# Patient Record
Sex: Female | Born: 1990 | Race: White | Hispanic: No | Marital: Married | Smoking: Former smoker
Health system: Southern US, Community
[De-identification: ages and names within clinical notes are randomized; demographics above are authoritative.]

## PROBLEM LIST (undated history)

## (undated) DIAGNOSIS — S3210XA Unspecified fracture of sacrum, initial encounter for closed fracture: Secondary | ICD-10-CM

## (undated) DIAGNOSIS — F419 Anxiety disorder, unspecified: Secondary | ICD-10-CM

## (undated) DIAGNOSIS — F988 Other specified behavioral and emotional disorders with onset usually occurring in childhood and adolescence: Secondary | ICD-10-CM

## (undated) DIAGNOSIS — S32599A Other specified fracture of unspecified pubis, initial encounter for closed fracture: Secondary | ICD-10-CM

## (undated) DIAGNOSIS — B019 Varicella without complication: Secondary | ICD-10-CM

## (undated) DIAGNOSIS — S060X9A Concussion with loss of consciousness of unspecified duration, initial encounter: Secondary | ICD-10-CM

## (undated) DIAGNOSIS — G43909 Migraine, unspecified, not intractable, without status migrainosus: Secondary | ICD-10-CM

## (undated) DIAGNOSIS — S32519A Fracture of superior rim of unspecified pubis, initial encounter for closed fracture: Secondary | ICD-10-CM

## (undated) DIAGNOSIS — T7840XA Allergy, unspecified, initial encounter: Secondary | ICD-10-CM

## (undated) HISTORY — DX: Migraine, unspecified, not intractable, without status migrainosus: G43.909

## (undated) HISTORY — DX: Other specified fracture of unspecified pubis, initial encounter for closed fracture: S32.599A

## (undated) HISTORY — DX: Varicella without complication: B01.9

## (undated) HISTORY — DX: Concussion with loss of consciousness of unspecified duration, initial encounter: S06.0X9A

## (undated) HISTORY — DX: Allergy, unspecified, initial encounter: T78.40XA

## (undated) HISTORY — DX: Fracture of superior rim of unspecified pubis, initial encounter for closed fracture: S32.519A

## (undated) HISTORY — DX: Unspecified fracture of sacrum, initial encounter for closed fracture: S32.10XA

---

## 2012-06-03 ENCOUNTER — Emergency Department (HOSPITAL_COMMUNITY): Payer: No Typology Code available for payment source

## 2012-06-03 ENCOUNTER — Inpatient Hospital Stay (HOSPITAL_COMMUNITY)
Admission: EM | Admit: 2012-06-03 | Discharge: 2012-06-07 | DRG: 536 | Disposition: A | Discharge status: Rehabilitation Facility | Payer: No Typology Code available for payment source | Attending: General Surgery | Admitting: General Surgery

## 2012-06-03 ENCOUNTER — Observation Stay (HOSPITAL_COMMUNITY): Payer: No Typology Code available for payment source

## 2012-06-03 ENCOUNTER — Encounter (HOSPITAL_COMMUNITY): Payer: Self-pay

## 2012-06-03 DIAGNOSIS — F411 Generalized anxiety disorder: Secondary | ICD-10-CM | POA: Diagnosis present

## 2012-06-03 DIAGNOSIS — S32509A Unspecified fracture of unspecified pubis, initial encounter for closed fracture: Principal | ICD-10-CM | POA: Diagnosis present

## 2012-06-03 DIAGNOSIS — F17200 Nicotine dependence, unspecified, uncomplicated: Secondary | ICD-10-CM | POA: Diagnosis present

## 2012-06-03 DIAGNOSIS — S060X9A Concussion with loss of consciousness of unspecified duration, initial encounter: Secondary | ICD-10-CM | POA: Diagnosis present

## 2012-06-03 DIAGNOSIS — S060XAA Concussion with loss of consciousness status unknown, initial encounter: Secondary | ICD-10-CM | POA: Diagnosis present

## 2012-06-03 DIAGNOSIS — F909 Attention-deficit hyperactivity disorder, unspecified type: Secondary | ICD-10-CM | POA: Diagnosis present

## 2012-06-03 DIAGNOSIS — Z23 Encounter for immunization: Secondary | ICD-10-CM

## 2012-06-03 DIAGNOSIS — S32599A Other specified fracture of unspecified pubis, initial encounter for closed fracture: Secondary | ICD-10-CM | POA: Diagnosis present

## 2012-06-03 DIAGNOSIS — S329XXA Fracture of unspecified parts of lumbosacral spine and pelvis, initial encounter for closed fracture: Secondary | ICD-10-CM

## 2012-06-03 DIAGNOSIS — S060X1A Concussion with loss of consciousness of 30 minutes or less, initial encounter: Secondary | ICD-10-CM | POA: Diagnosis present

## 2012-06-03 DIAGNOSIS — IMO0002 Reserved for concepts with insufficient information to code with codable children: Secondary | ICD-10-CM | POA: Diagnosis present

## 2012-06-03 DIAGNOSIS — T07XXXA Unspecified multiple injuries, initial encounter: Secondary | ICD-10-CM | POA: Diagnosis present

## 2012-06-03 DIAGNOSIS — S32519A Fracture of superior rim of unspecified pubis, initial encounter for closed fracture: Secondary | ICD-10-CM | POA: Diagnosis present

## 2012-06-03 DIAGNOSIS — S3210XA Unspecified fracture of sacrum, initial encounter for closed fracture: Secondary | ICD-10-CM | POA: Diagnosis present

## 2012-06-03 DIAGNOSIS — S322XXA Fracture of coccyx, initial encounter for closed fracture: Secondary | ICD-10-CM | POA: Diagnosis present

## 2012-06-03 HISTORY — DX: Other specified behavioral and emotional disorders with onset usually occurring in childhood and adolescence: F98.8

## 2012-06-03 HISTORY — DX: Anxiety disorder, unspecified: F41.9

## 2012-06-03 LAB — URINALYSIS, ROUTINE W REFLEX MICROSCOPIC
Bilirubin Urine: NEGATIVE
Glucose, UA: NEGATIVE mg/dL
Ketones, ur: NEGATIVE mg/dL
Leukocytes, UA: NEGATIVE
Nitrite: NEGATIVE
Protein, ur: 100 mg/dL — AB
Specific Gravity, Urine: 1.01 (ref 1.005–1.030)
Urobilinogen, UA: 1 mg/dL (ref 0.0–1.0)
pH: 7 (ref 5.0–8.0)

## 2012-06-03 LAB — POCT I-STAT, CHEM 8
BUN: 13 mg/dL (ref 6–23)
Calcium, Ion: 1.11 mmol/L — ABNORMAL LOW (ref 1.12–1.23)
Chloride: 100 mEq/L (ref 96–112)
Creatinine, Ser: 0.8 mg/dL (ref 0.50–1.10)
Glucose, Bld: 138 mg/dL — ABNORMAL HIGH (ref 70–99)
HCT: 42 % (ref 36.0–46.0)
Hemoglobin: 14.3 g/dL (ref 12.0–15.0)
Potassium: 3.5 mEq/L (ref 3.5–5.1)
Sodium: 138 mEq/L (ref 135–145)
TCO2: 24 mmol/L (ref 0–100)

## 2012-06-03 LAB — URINE MICROSCOPIC-ADD ON

## 2012-06-03 LAB — POCT PREGNANCY, URINE: Preg Test, Ur: NEGATIVE

## 2012-06-03 MED ORDER — HYDROMORPHONE HCL PF 1 MG/ML IJ SOLN
2.0000 mg | INTRAMUSCULAR | Status: DC | PRN
  Administered 2012-06-03: 2 mg via INTRAVENOUS

## 2012-06-03 MED ORDER — ENOXAPARIN SODIUM 40 MG/0.4ML ~~LOC~~ SOLN
40.0000 mg | SUBCUTANEOUS | Status: DC
  Administered 2012-06-03 – 2012-06-06 (×4): 40 mg via SUBCUTANEOUS
  Filled 2012-06-03 (×5): qty 0.4

## 2012-06-03 MED ORDER — PANTOPRAZOLE SODIUM 40 MG IV SOLR
40.0000 mg | Freq: Every day | INTRAVENOUS | Status: DC
  Filled 2012-06-03 (×4): qty 40

## 2012-06-03 MED ORDER — HYDROMORPHONE HCL PF 1 MG/ML IJ SOLN
0.5000 mg | INTRAMUSCULAR | Status: DC | PRN
  Administered 2012-06-03 – 2012-06-04 (×5): 1 mg via INTRAVENOUS
  Filled 2012-06-03 (×6): qty 1

## 2012-06-03 MED ORDER — DOCUSATE SODIUM 100 MG PO CAPS
100.0000 mg | ORAL_CAPSULE | Freq: Two times a day (BID) | ORAL | Status: DC
  Administered 2012-06-03 – 2012-06-06 (×6): 100 mg via ORAL
  Filled 2012-06-03 (×10): qty 1

## 2012-06-03 MED ORDER — IOHEXOL 300 MG/ML  SOLN
80.0000 mL | Freq: Once | INTRAMUSCULAR | Status: AC | PRN
  Administered 2012-06-03: 80 mL via INTRAVENOUS

## 2012-06-03 MED ORDER — TETANUS-DIPHTHERIA TOXOIDS TD 5-2 LFU IM INJ: 0.5000 mL | INJECTION | Freq: Once | INTRAMUSCULAR | Status: DC

## 2012-06-03 MED ORDER — KCL IN DEXTROSE-NACL 20-5-0.45 MEQ/L-%-% IV SOLN
INTRAVENOUS | Status: DC
  Administered 2012-06-04 – 2012-06-06 (×2): via INTRAVENOUS
  Filled 2012-06-03 (×7): qty 1000

## 2012-06-03 MED ORDER — ONDANSETRON HCL 4 MG/2ML IJ SOLN
INTRAMUSCULAR | Status: AC
  Administered 2012-06-03: 4 mg via INTRAVENOUS
  Filled 2012-06-03: qty 2

## 2012-06-03 MED ORDER — ONDANSETRON HCL 8 MG PO TABS
4.0000 mg | ORAL_TABLET | Freq: Four times a day (QID) | ORAL | Status: DC | PRN
  Filled 2012-06-03: qty 0.5

## 2012-06-03 MED ORDER — MORPHINE SULFATE 4 MG/ML IJ SOLN
4.0000 mg | Freq: Once | INTRAMUSCULAR | Status: AC
  Administered 2012-06-03: 4 mg via INTRAVENOUS

## 2012-06-03 MED ORDER — SODIUM CHLORIDE 0.9 % IV SOLN
INTRAVENOUS | Status: AC | PRN
  Administered 2012-06-03: 100 mL/h via INTRAVENOUS

## 2012-06-03 MED ORDER — PANTOPRAZOLE SODIUM 40 MG PO TBEC
40.0000 mg | DELAYED_RELEASE_TABLET | Freq: Every day | ORAL | Status: DC
  Administered 2012-06-04 – 2012-06-06 (×3): 40 mg via ORAL
  Filled 2012-06-03 (×3): qty 1

## 2012-06-03 MED ORDER — HYDROMORPHONE HCL PF 2 MG/ML IJ SOLN
INTRAMUSCULAR | Status: AC
  Filled 2012-06-03: qty 1

## 2012-06-03 MED ORDER — TETANUS-DIPHTH-ACELL PERTUSSIS 5-2.5-18.5 LF-MCG/0.5 IM SUSP
INTRAMUSCULAR | Status: AC
  Administered 2012-06-03: 0.5 mL via INTRAMUSCULAR
  Filled 2012-06-03: qty 0.5

## 2012-06-03 MED ORDER — MORPHINE SULFATE 4 MG/ML IJ SOLN
INTRAMUSCULAR | Status: AC
  Filled 2012-06-03: qty 1

## 2012-06-03 MED ORDER — ONDANSETRON HCL 4 MG/2ML IJ SOLN
4.0000 mg | Freq: Four times a day (QID) | INTRAMUSCULAR | Status: DC | PRN
  Administered 2012-06-03 (×2): 4 mg via INTRAVENOUS
  Filled 2012-06-03: qty 2

## 2012-06-03 MED ORDER — MORPHINE SULFATE 4 MG/ML IJ SOLN
4.0000 mg | Freq: Once | INTRAMUSCULAR | Status: AC
  Administered 2012-06-03: 4 mg via INTRAVENOUS
  Filled 2012-06-03: qty 1

## 2012-06-03 MED ORDER — TETANUS-DIPHTH-ACELL PERTUSSIS 5-2.5-18.5 LF-MCG/0.5 IM SUSP
0.5000 mL | Freq: Once | INTRAMUSCULAR | Status: AC
  Administered 2012-06-03: 0.5 mL via INTRAMUSCULAR
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
## 2012-06-03 NOTE — ED Notes (Signed)
Pt given water to sip on when no longer nauseated from medication

## 2012-06-03 NOTE — ED Notes (Signed)
Vital signs stable. 
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
## 2012-06-03 NOTE — ED Notes (Signed)
Chaplain attempting to reach mother for patient to update of status. Unable to reach her at this time.

## 2012-06-03 NOTE — ED Notes (Signed)
Pt desating after Dil[place]. Alert and oriented x 4, pt speaking with family members. Placed on O2 at 2liters/ [place] to maintain sats > 90 %
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
## 2012-06-03 NOTE — ED Notes (Signed)
Pt arrived in CT scan with this RN

## 2012-06-03 NOTE — ED Notes (Signed)
Pt returned from xray and transported upstairs with tech with mother via stretcher.
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
## 2012-06-03 NOTE — ED Notes (Signed)
Inserted a 14 Fr cath, Patient did well

## 2012-06-03 NOTE — ED Notes (Signed)
[place] [employer] at the bedside to talk with patient.
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
## 2012-06-03 NOTE — ED Notes (Signed)
Pt back from CT, mother and sister at the bedside.

## 2012-06-03 NOTE — ED Notes (Signed)
Portable pelvis and chest being completed

## 2012-06-03 NOTE — ED Notes (Signed)
Pt taken back to xray. 

## 2012-06-03 NOTE — ED Notes (Signed)
Dr. Ethelda Chick back at the bedside. Pt not feeling any better after first dose of pain medicine but declining any more at this time.

## 2012-06-04 LAB — CBC
MCH: 32.1 pg (ref 26.0–34.0)
MCV: 92.4 fL (ref 78.0–100.0)
Platelets: 168 10*3/uL (ref 150–400)
RBC: 3.83 MIL/uL — ABNORMAL LOW (ref 3.87–5.11)

## 2012-06-04 MED ORDER — HYDROMORPHONE BOLUS VIA INFUSION: 1.0000 mg | Freq: Once | INTRAVENOUS | Status: DC

## 2012-06-04 MED ORDER — ACETAMINOPHEN 325 MG PO TABS: 650.0000 mg | ORAL_TABLET | Freq: Four times a day (QID) | ORAL | Status: DC | PRN

## 2012-06-04 MED ORDER — HYDROMORPHONE HCL PF 1 MG/ML IJ SOLN
1.0000 mg | Freq: Once | INTRAMUSCULAR | Status: AC
  Administered 2012-06-04: 1 mg via INTRAVENOUS

## 2012-06-04 MED ORDER — OXYCODONE HCL 5 MG PO TABS
10.0000 mg | ORAL_TABLET | ORAL | Status: DC | PRN
  Administered 2012-06-04 – 2012-06-07 (×13): 10 mg via ORAL
  Filled 2012-06-04 (×13): qty 2

## 2012-06-04 MED ORDER — HYDROMORPHONE HCL PF 1 MG/ML IJ SOLN
1.0000 mg | INTRAMUSCULAR | Status: DC | PRN
  Administered 2012-06-04 – 2012-06-06 (×7): 1 mg via INTRAVENOUS
  Filled 2012-06-04 (×2): qty 1
  Filled 2012-06-04: qty 2
  Filled 2012-06-04 (×5): qty 1
  Filled 2012-06-04: qty 2

## 2012-06-04 MED ORDER — HYDROMORPHONE HCL PF 1 MG/ML IJ SOLN: 0.5000 mg | Freq: Once | INTRAMUSCULAR | Status: DC
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
## 2012-06-04 NOTE — Progress Notes (Signed)
Seen together and I spoke to the patient's mother Continue pain control and therapies Patient examined and I agree with the assessment and plan  [name], MD, MPH, FACS Pager: [phone]  06/04/2012 1:07 PM
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
## 2012-06-04 NOTE — Progress Notes (Signed)
Physical Therapy Treatment Patient Details Name: Morgan Braun MRN: [id number] DOB: [birth year] Today's Date: 06/04/2012 Time: 4098-1191 PT Time Calculation (min): 21 min  PT Assessment / Plan / Recommendation Comments on Treatment Session  Pt was pleasent and cooperative but expressed significant pain with all functional mobility.  Pt will benefit from skilled physical therapy to improve activity tolerance and progress WBAT and perform ambulation.    Follow Up Recommendations  Inpatient Rehab    Barriers to Discharge        Equipment Recommendations  Defer to next venue    Recommendations for Other Services Rehab consult  Frequency Min 5X/week   Plan Discharge plan remains appropriate    Precautions / Restrictions Precautions Precautions: Fall Restrictions Weight Bearing Restrictions: Yes RLE Weight Bearing: Weight bearing as tolerated LLE Weight Bearing: Weight bearing as tolerated   Pertinent Vitals/Pain 9/10    Mobility  Bed Mobility Bed Mobility: Sit to Supine;Scooting to HOB Supine to Sit: 1: +2 Total assist;HOB flat Supine to Sit: Patient Percentage: 20% Sitting - Scoot to Edge of Bed: 1: +2 Total assist Sitting - Scoot to Edge of Bed: Patient Percentage: 50% Sit to Supine: 1: +2 Total assist;HOB flat Sit to Supine: Patient Percentage: 40% Scooting to HOB: 1: +2 Total assist Scooting to [facility]: Patient Percentage: 20% Details for Bed Mobility Assistance: Pt req max assist to raise legs and rotate and lower trunk Transfers Transfers: Sit to Stand;Stand to Sit;Stand Pivot Transfers Sit to Stand: 1: +2 Total assist;From bed Sit to Stand: Patient Percentage: 50% Stand to Sit: 1: +2 Total assist;To chair/3-in-1 Stand to Sit: Patient Percentage: 50% Stand Pivot Transfers: 1: +2 Total assist Stand Pivot Transfers: Patient Percentage: 50% Details for Transfer Assistance: +2 (A) to intiate transfer and slowly descend to chair with max cues for technique.  Pt unable  to weight shift to advance LE due to severe pain. Ambulation/Gait Ambulation/Gait Assistance: Not tested (comment)    Exercises     PT Diagnosis: Difficulty walking;Generalized weakness;Acute pain  PT Problem List: Decreased strength;Decreased range of motion;Decreased activity tolerance;Decreased balance;Decreased mobility;Decreased knowledge of use of DME;Pain PT Treatment Interventions: DME instruction;Gait training;Functional mobility training;Therapeutic activities;Therapeutic exercise;Patient/family education   PT Goals Acute Rehab PT Goals PT Goal Formulation: With patient/family Time For Goal Achievement: 06/11/12 Potential to Achieve Goals: Good Pt will go Supine/Side to Sit: with min assist PT Goal: Supine/Side to Sit - Progress: Goal set today Pt will Sit at Edge of Bed: with modified independence;3-5 min PT Goal: Sit at Edge Of Bed - Progress: Progressing toward goal Pt will go Sit to Supine/Side: with min assist PT Goal: Sit to Supine/Side - Progress: Progressing toward goal Pt will go Sit to Stand: with min assist PT Goal: Sit to Stand - Progress: Progressing toward goal Pt will go Stand to Sit: with min assist PT Goal: Stand to Sit - Progress: Goal set today Pt will Transfer Bed to Chair/Chair to Bed: with min assist PT Transfer Goal: Bed to Chair/Chair to Bed - Progress: Goal set today Pt will Stand: with supervision;3 - 5 min PT Goal: Stand - Progress: Goal set today Pt will Ambulate: 16 - 50 feet;with min assist;with rolling walker PT Goal: Ambulate - Progress: Goal set today  Visit Information  Last PT Received On: 06/04/12 Assistance Needed: +1    Subjective Data  Subjective: "I feel very disoriented" Patient Stated Goal: To eventually go back to [place].   Cognition  Overall Cognitive Status: Appears within functional limits for tasks  assessed/performed Arousal/Alertness: Awake/alert Orientation Level: Appears intact for tasks assessed Behavior During  Session: [facility] for tasks performed    Balance  Balance Balance Assessed: Yes Static Sitting Balance Static Sitting - Balance Support: Feet supported Static Sitting - Level of Assistance: 4: Min assist;5: Stand by assistance Static Sitting - Comment/# of Minutes: initially min A required, as pt became acclimated she was able to maintain static balance until fatigue then require min assist again.  Approx 6 mins of static sitting while nsg performed dressing change  End of Session PT - End of Session Equipment Utilized During Treatment: Gait belt Activity Tolerance: Patient limited by pain Patient left: in bed;with call bell/phone within reach;with family/visitor present Nurse Communication: Mobility status   GP     Fabio Asa 06/04/2012, 1:55 PM Charlotte Crumb, PT DPT  [phone]

## 2012-06-04 NOTE — Progress Notes (Signed)
UR completed.   Await PT eval to determine exact DME needs for discharge.

## 2012-06-05 ENCOUNTER — Encounter (INDEPENDENT_AMBULATORY_CARE_PROVIDER_SITE_OTHER): Payer: Self-pay | Admitting: Internal Medicine

## 2012-06-05 DIAGNOSIS — S329XXA Fracture of unspecified parts of lumbosacral spine and pelvis, initial encounter for closed fracture: Secondary | ICD-10-CM

## 2012-06-05 DIAGNOSIS — M7989 Other specified soft tissue disorders: Secondary | ICD-10-CM

## 2012-06-05 DIAGNOSIS — M79609 Pain in unspecified limb: Secondary | ICD-10-CM

## 2012-06-05 DIAGNOSIS — IMO0002 Reserved for concepts with insufficient information to code with codable children: Secondary | ICD-10-CM

## 2012-06-05 MED ORDER — METHOCARBAMOL 500 MG PO TABS
500.0000 mg | ORAL_TABLET | Freq: Four times a day (QID) | ORAL | Status: DC | PRN
  Administered 2012-06-05 – 2012-06-07 (×5): 500 mg via ORAL
  Filled 2012-06-05 (×5): qty 1

## 2012-06-05 MED ORDER — PNEUMOCOCCAL VAC POLYVALENT 25 MCG/0.5ML IJ INJ
0.5000 mL | INJECTION | INTRAMUSCULAR | Status: AC
  Administered 2012-06-06: 0.5 mL via INTRAMUSCULAR
  Filled 2012-06-05: qty 0.5
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
## 2012-06-05 NOTE — Progress Notes (Signed)
Pt getting up to chair with PT. While pt is up all dressings from abrasions changed. Pt has R hand/knuckle dressing that is dry and intact. That dressing not changed. L elbow pt had a mepitel. Mepitel isn't sticking. Abrasion cleaned with normal saline and a nonadherent dressing and gauze applied. No s/sx infection noted. Small amount of serous drainage. R lateral knee has a mepitel that is dry and intact. That dressing wasn't changed. Pt has a L knee gauze dressing that has a moderate amount of serous drainage. Dressing removed. Abrasion cleaned with normal saline and a new gauze dressing applied. No s/sx infection noted. Only a small amount of serous drainage noted from abrasion. Pt has stained dressings of back that were removed and redressed per order. 3 abrasions of upper back and one of low mid back and mid lumbar. All areas free from  s/sx infection. Only small amount of serous drainage noted from abrasions. Xeroform and dry gauze dressings applied and afixed with hypofix tape after cleaning with normal saline. Pt tolerated well.

## 2012-06-05 NOTE — Progress Notes (Signed)
VASCULAR LAB PRELIMINARY  PRELIMINARY  PRELIMINARY  PRELIMINARY  Lower extremity venous duplex completed.    Preliminary report:  No evidence of DVT or superficial thrombus noted in the right lower extremity and left common femoral veins . No evidence of superficial thrombosis or Baker's cyst  Hoke Baer, RVS 06/05/2012, 2:32 PM

## 2012-06-05 NOTE — Progress Notes (Signed)
Physical Therapy Treatment Patient Details Name: Morgan Braun MRN: [id number] DOB: 1991/10/13 Today's Date: 06/05/2012 Time: 1914-7829 PT Time Calculation (min): 47 min  PT Assessment / Plan / Recommendation Comments on Treatment Session  Pt agreeable to PT however continues to be limited due to severe pain with any mobility.  Continue to recommend CIR prior to d/c home to improve overall mobility.     Follow Up Recommendations  Inpatient Rehab    Barriers to Discharge        Equipment Recommendations  Defer to next venue    Recommendations for Other Services    Frequency Min 5X/week   Plan Discharge plan remains appropriate;Frequency remains appropriate    Precautions / Restrictions Precautions Precautions: Fall Restrictions Weight Bearing Restrictions: Yes RLE Weight Bearing: Weight bearing as tolerated LLE Weight Bearing: Weight bearing as tolerated   Pertinent Vitals/Pain 8/10 bilateral pelvis pain; premedicated    Mobility  Bed Mobility Bed Mobility: Sit to Supine;Scooting to HOB Supine to Sit: 1: +2 Total assist;HOB flat Supine to Sit: Patient Percentage: 20% Sitting - Scoot to Edge of Bed: 1: +2 Total assist Sitting - Scoot to Edge of Bed: Patient Percentage: 30% Details for Bed Mobility Assistance: +2 (A) to elevate trunk OOB and elevate LE OOB.  Pt continues to be limited due to severe pain and needs extra time to complete task.   Transfers Transfers: Sit to Stand;Stand to Sit Sit to Stand: 1: +2 Total assist;From bed Sit to Stand: Patient Percentage: 50% Stand to Sit: 1: +2 Total assist;To chair/3-in-1 Stand to Sit: Patient Percentage: 50% Details for Transfer Assistance: +2 (A) to initiate transfer and slowly descend to recliner with max cues for hand placement.   Ambulation/Gait Ambulation/Gait Assistance: 1: +2 Total assist Ambulation/Gait: Patient Percentage: 60% Ambulation Distance (Feet): 4 Feet Assistive device: Rolling walker Ambulation/Gait  Assistance Details: +2 (A) for safety and maintain balance.  Pt tends to keep feet in PF to unweight LE with ambulation.  Pt continues to have difficulty weight shifting bilateral LE due to severe pain. Gait Pattern: Step-to pattern;Decreased stride length;Shuffle;Antalgic;Trunk flexed    Exercises     PT Diagnosis:    PT Problem List:   PT Treatment Interventions:     PT Goals Acute Rehab PT Goals PT Goal Formulation: With patient/family Time For Goal Achievement: 06/11/12 Potential to Achieve Goals: Good Pt will go Supine/Side to Sit: with min assist PT Goal: Supine/Side to Sit - Progress: Progressing toward goal Pt will Sit at Edge of Bed: with modified independence;3-5 min PT Goal: Sit at Edge Of Bed - Progress: Progressing toward goal Pt will go Sit to Supine/Side: with min assist PT Goal: Sit to Supine/Side - Progress: Progressing toward goal Pt will go Sit to Stand: with min assist PT Goal: Sit to Stand - Progress: Progressing toward goal Pt will go Stand to Sit: with min assist PT Goal: Stand to Sit - Progress: Progressing toward goal Pt will Transfer Bed to Chair/Chair to Bed: with min assist PT Transfer Goal: Bed to Chair/Chair to Bed - Progress: Progressing toward goal Pt will Stand: with supervision;3 - 5 min PT Goal: Stand - Progress: Progressing toward goal Pt will Ambulate: 16 - 50 feet;with min assist;with rolling walker PT Goal: Ambulate - Progress: Progressing toward goal  Visit Information  Last PT Received On: 06/05/12 Assistance Needed: +2    Subjective Data  Subjective: "I feel more sore today." Patient Stated Goal: Family agreeable to inpatient rehab   Cognition  Overall Cognitive  Status: Appears within functional limits for tasks assessed/performed Arousal/Alertness: Awake/alert Orientation Level: Appears intact for tasks assessed Behavior During Session: [facility] for tasks performed    Balance  Balance Balance Assessed: Yes Static Sitting  Balance Static Sitting - Balance Support: Feet supported Static Sitting - Level of Assistance: 4: Min assist;5: Stand by assistance Static Sitting - Comment/# of Minutes: Initial min (A) to maintain balance due to posterior lean and able to progress to supervision.  End of Session PT - End of Session Equipment Utilized During Treatment: Gait belt Activity Tolerance: Patient limited by pain Patient left: in chair;with call bell/phone within reach;with nursing in room Nurse Communication: Mobility status   GP     Elvan Ebron 06/05/2012, 3:40 PM Jake Shark, PT DPT [phone]

## 2012-06-05 NOTE — Progress Notes (Signed)
Patient ID: Morgan Braun, female   DOB: 1991/05/12, 21 y.o.   MRN: [id number] Patient is slow with physical therapy no focal neurologic deficits in her lower extremities. Discharge when safe with physical therapy transfers.
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
## 2012-06-05 NOTE — Progress Notes (Signed)
The patient's right LE is more swollen than her left.  Will Get Duplex venous study. This patient has been seen and I agree with the findings and treatment plan.  Marta Lamas. Gae Bon, MD, FACS [phone] (pager) [phone] (direct pager) Trauma Surgeon

## 2012-06-05 NOTE — Progress Notes (Signed)
Physical Therapy Progress Note   06/05/12 1540  PT Visit Information  Last PT Received On 06/05/12  Assistance Needed +2  PT Time Calculation  PT Start Time 1340  PT Stop Time 1407  PT Time Calculation (min) 27 min  Subjective Data  Subjective "I feel more sore today."  Patient Stated Goal Family agreeable to inpatient rehab  Precautions  Precautions Fall  Restrictions  Weight Bearing Restrictions Yes  RLE Weight Bearing WBAT  LLE Weight Bearing WBAT  Cognition  Overall Cognitive Status Appears within functional limits for tasks assessed/performed  Arousal/Alertness Awake/alert  Orientation Level Appears intact for tasks assessed  Behavior During Session [facility] for tasks performed  Bed Mobility  Bed Mobility Supine to Sit  Sit to Supine 1: +2 Total assist;HOB flat  Sit to Supine: Patient Percentage 30%  Scooting to HOB 1: +2 Total assist  Scooting to [facility]: Patient Percentage 20%  Details for Bed Mobility Assistance +2 (A) to slowly descend trunk and elevate LE into bed with max cues for technique.  Pt needed extra time and step by step instructions to complete task.  Transfers  Transfers Sit to Stand;Stand to Sit  Sit to Stand 1: +2 Total assist;From bed  Sit to Stand: Patient Percentage 50%  Stand to Sit 1: +2 Total assist;To chair/3-in-1  Stand to Sit: Patient Percentage 50%  Details for Transfer Assistance +2 (A) to initiate transfer and slowly descend to recliner with max cues for hand placement.    Ambulation/Gait  Ambulation/Gait Assistance 1: +2 Total assist  Ambulation/Gait: Patient Percentage 60%  Ambulation Distance (Feet) 4 Feet  Assistive device Rolling walker  Ambulation/Gait Assistance Details +2 (A) for safety and maintain balance. Pt tends to keep feet in PF to unweight LE with ambulation. Pt continues to have difficulty weight shifting bilateral LE due to severe pain.  Gait Pattern Step-to pattern;Decreased stride length;Shuffle;Antalgic;Trunk flexed  Balance   Balance Assessed Yes  Static Sitting Balance  Static Sitting - Balance Support Feet supported  Static Sitting - Level of Assistance 4: Min assist;5: Stand by assistance  PT - End of Session  Equipment Utilized During Treatment Gait belt  Activity Tolerance Patient limited by pain  Patient left in chair;with call bell/phone within reach;with nursing in room  Nurse Communication Mobility status  PT - Assessment/Plan  Comments on Treatment Session Pt agreeable to PT however continues to be limited due to severe pain with any mobility.  Continue to recommend CIR prior to d/c home to improve overall mobility.   PT Plan Discharge plan remains appropriate;Frequency remains appropriate  PT Frequency Min 5X/week  Follow Up Recommendations Inpatient Rehab  Equipment Recommended Defer to next venue  Acute Rehab PT Goals  PT Goal Formulation With patient/family  Time For Goal Achievement 06/11/12  Potential to Achieve Goals Good  Pt will go Supine/Side to Sit with min assist  PT Goal: Supine/Side to Sit - Progress Progressing toward goal  Pt will Sit at [facility] of Bed with modified independence;3-5 min  PT Goal: Sit at Edge Of Bed - Progress Progressing toward goal  Pt will go Sit to Supine/Side with min assist  PT Goal: Sit to Supine/Side - Progress Progressing toward goal  Pt will go Sit to Stand with min assist  PT Goal: Sit to Stand - Progress Progressing toward goal  Pt will go Stand to Sit with min assist  PT Goal: Stand to Sit - Progress Progressing toward goal  Pt will Transfer Bed to Chair/Chair to Bed  with min assist  PT Transfer Goal: Bed to Chair/Chair to Bed - Progress Progressing toward goal  Pt will Stand with supervision;3 - 5 min  PT Goal: Stand - Progress Progressing toward goal  Pt will Ambulate 16 - 50 feet;with min assist;with rolling walker  PT Goal: Ambulate - Progress Progressing toward goal     [facility], PT DPT [phone]
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
## 2012-06-06 DIAGNOSIS — S060X9A Concussion with loss of consciousness of unspecified duration, initial encounter: Secondary | ICD-10-CM

## 2012-06-06 MED ORDER — POLYETHYLENE GLYCOL 3350 17 G PO PACK
17.0000 g | PACK | Freq: Every day | ORAL | Status: DC
  Administered 2012-06-06 – 2012-06-07 (×2): 17 g via ORAL
  Filled 2012-06-06 (×2): qty 1

## 2012-06-06 NOTE — Progress Notes (Signed)
06/06/12 Pt refusing hygiene regimen. Pt has been asked numerous times throughout the day whether she would like a bath. Pt states she would, but not until a certain time in the future. Nursing staff, Tech and OT have all offered to help her, but she will put it off each time. Will continue to encourage pt to comply with hygiene regimen and will pass along to next shift to do so as well.

## 2012-06-06 NOTE — Progress Notes (Signed)
Physical Therapy Progress Note   06/06/12 1400  PT Visit Information  Last PT Received On 06/06/12  Assistance Needed +2  PT Time Calculation  PT Start Time 1152  PT Stop Time 1209  PT Time Calculation (min) 17 min  Subjective Data  Subjective "Pain much better today."  Precautions  Precautions Fall  Restrictions  Weight Bearing Restrictions Yes  RLE Weight Bearing WBAT  LLE Weight Bearing WBAT  Cognition  Overall Cognitive Status Appears within functional limits for tasks assessed/performed  Arousal/Alertness Awake/alert  Orientation Level Appears intact for tasks assessed  Behavior During Session [facility] for tasks performed  Bed Mobility  Bed Mobility Sit to Supine  Sit to Supine 1: +2 Total assist;HOB flat  Sit to Supine: Patient Percentage 30%  Scooting to HOB 1: +2 Total assist  Scooting to [facility]: Patient Percentage 20%  Details for Bed Mobility Assistance +2 (A) to slowly descend trunk into bed and elevate LE into bed with max cues for technique.  Transfers  Transfers Sit to Stand;Stand to Sit  Sit to Stand 1: +2 Total assist;From bed  Sit to Stand: Patient Percentage 60%  Stand to Sit 1: +2 Total assist;To chair/3-in-1  Stand to Sit: Patient Percentage 60%  Details for Transfer Assistance Pt. requires increased time to move sit to stand, and step by step cues to push through arms and to pivot feet/take step.  Assist required to move walker  Ambulation/Gait  Ambulation/Gait Assistance 1: +2 Total assist  Ambulation/Gait: Patient Percentage 60%  Ambulation Distance (Feet) 6 Feet  Assistive device Rolling walker  Ambulation/Gait Assistance Details +2 (A) for safety and maintain balance. Pt tends to keep feet in PF to unweight LE with ambulation. Pt continues to have difficulty weight shifting bilateral LE due to severe pain  Gait Pattern Step-to pattern;Decreased stride length;Shuffle;Antalgic;Trunk flexed  PT - End of Session  Equipment Utilized During Treatment Gait belt    Activity Tolerance Patient limited by pain  Patient left in bed;with call bell/phone within reach;with family/visitor present  Nurse Communication Mobility status  PT - Assessment/Plan  Comments on Treatment Session Pt continues to be limited due to pain but very willing to try and attempt PT.  Pt able to increase ambulation distance but having difficulty with weight shifts due to pain.  PT Plan Discharge plan remains appropriate;Frequency remains appropriate  PT Frequency Min 5X/week  Follow Up Recommendations Inpatient Rehab  Equipment Recommended Defer to next venue  Acute Rehab PT Goals  PT Goal Formulation With patient/family  Time For Goal Achievement 06/11/12  Potential to Achieve Goals Good  Pt will go Supine/Side to Sit with min assist  PT Goal: Supine/Side to Sit - Progress Progressing toward goal  Pt will Sit at [facility] of Bed with modified independence;3-5 min  PT Goal: Sit at Edge Of Bed - Progress Progressing toward goal  Pt will go Sit to Supine/Side with min assist  PT Goal: Sit to Supine/Side - Progress Progressing toward goal  Pt will go Sit to Stand with min assist  PT Goal: Sit to Stand - Progress Progressing toward goal  Pt will go Stand to Sit with min assist  PT Goal: Stand to Sit - Progress Progressing toward goal  Pt will Transfer Bed to Chair/Chair to Bed with min assist  PT Transfer Goal: Bed to Chair/Chair to Bed - Progress Progressing toward goal  Pt will Stand with supervision;3 - 5 min  PT Goal: Stand - Progress Progressing toward goal  Pt will Ambulate  16 - 50 feet;with min assist;with rolling walker  PT Goal: Ambulate - Progress Progressing toward goal  PT General Charges  $$ ACUTE PT VISIT 1 Procedure  PT Treatments  $Therapeutic Activity 8-22 mins    [place], [place] DPT [phone]

## 2012-06-06 NOTE — Progress Notes (Signed)
Physical Therapy Treatment Patient Details Name: Morgan Braun MRN: [id number] DOB: [birth year] Today's Date: 06/06/2012 Time: 7829-5621 PT Time Calculation (min): 39 min  PT Assessment / Plan / Recommendation Comments on Treatment Session  Pt continues to show slow progression with slow improvement with overall pain.    Follow Up Recommendations  Inpatient Rehab    Barriers to Discharge        Equipment Recommendations  Defer to next venue    Recommendations for Other Services    Frequency Min 5X/week   Plan Discharge plan remains appropriate;Frequency remains appropriate    Precautions / Restrictions Precautions Precautions: Fall Restrictions Weight Bearing Restrictions: Yes RLE Weight Bearing: Weight bearing as tolerated LLE Weight Bearing: Weight bearing as tolerated   Pertinent Vitals/Pain 6-7/10 bilateral hip pain    Mobility  Bed Mobility Bed Mobility: Supine to Sit;Sitting - Scoot to Edge of Bed Supine to Sit: 1: +2 Total assist;HOB flat Supine to Sit: Patient Percentage: 40% Sitting - Scoot to Edge of Bed: 1: +2 Total assist Sitting - Scoot to Edge of Bed: Patient Percentage: 40% Details for Bed Mobility Assistance: Pt. requires assist with bil. UEs and step by step cues for hand placement to lift UB.   Transfers Sit to Stand: 1: +2 Total assist;From bed Sit to Stand: Patient Percentage: 60% Stand to Sit: 1: +2 Total assist;To chair/3-in-1 Stand to Sit: Patient Percentage: 60% Details for Transfer Assistance: Pt. requires increased time to move sit to stand, and step by step cues to push through arms and to pivot feet/take step.  Assist required to move walker Ambulation/Gait Ambulation/Gait Assistance: 1: +2 Total assist Ambulation/Gait: Patient Percentage: 60% Ambulation Distance (Feet): 5 Feet Assistive device: Rolling walker Ambulation/Gait Assistance Details: +2 (A) for safety and maintain balance. Pt tends to keep feet in PF to unweight LE with  ambulation. Pt continues to have difficulty weight shifting bilateral LE due to severe pain. Gait Pattern: Step-to pattern;Decreased stride length;Shuffle;Antalgic;Trunk flexed    Exercises     PT Diagnosis:    PT Problem List:   PT Treatment Interventions:     PT Goals Acute Rehab PT Goals PT Goal Formulation: With patient/family Time For Goal Achievement: 06/11/12 Potential to Achieve Goals: Good Pt will go Supine/Side to Sit: with min assist PT Goal: Supine/Side to Sit - Progress: Progressing toward goal Pt will Sit at Edge of Bed: with modified independence;3-5 min PT Goal: Sit at Edge Of Bed - Progress: Progressing toward goal Pt will go Sit to Supine/Side: with min assist PT Goal: Sit to Supine/Side - Progress: Progressing toward goal Pt will go Sit to Stand: with min assist PT Goal: Sit to Stand - Progress: Progressing toward goal Pt will go Stand to Sit: with min assist PT Goal: Stand to Sit - Progress: Progressing toward goal Pt will Transfer Bed to Chair/Chair to Bed: with min assist PT Transfer Goal: Bed to Chair/Chair to Bed - Progress: Progressing toward goal Pt will Stand: with supervision;3 - 5 min PT Goal: Stand - Progress: Progressing toward goal Pt will Ambulate: 16 - 50 feet;with min assist;with rolling walker PT Goal: Ambulate - Progress: Progressing toward goal  Visit Information  Last PT Received On: 06/06/12 Assistance Needed: +2    Subjective Data  Subjective: "I'm feeling a little better today."   Cognition  Overall Cognitive Status: Appears within functional limits for tasks assessed/performed Arousal/Alertness: Awake/alert Orientation Level: Appears intact for tasks assessed Behavior During Session: [facility] for tasks performed Cognition - Other Comments: No  obvious cognitive deficits noted during Eval.  Pt. answering questions appropriately, alternating attention, and recalling events of yesterday.  Will continue to asses cognition through function     Balance  Balance Balance Assessed: Yes Static Sitting Balance Static Sitting - Balance Support: Feet supported Static Sitting - Level of Assistance: 4: Min assist;5: Stand by assistance Static Sitting - Comment/# of Minutes: Initial min (A) to maintain balance due to posterior lean  End of Session PT - End of Session Equipment Utilized During Treatment: Gait belt Activity Tolerance: Patient limited by pain Patient left: in chair;with call bell/phone within reach;with nursing in room Nurse Communication: Mobility status   GP     Morgan Braun 06/06/2012, 1:36 PM
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
## 2012-06-06 NOTE — Progress Notes (Signed)
06/06/12 1800  OT Visit Information  Last OT Received On 06/06/12  Assistance Needed +2  OT Time Calculation  OT Start Time 1152  OT Stop Time 1209  OT Time Calculation (min) 17 min  Precautions  Precautions Fall  Restrictions  Weight Bearing Restrictions Yes  RLE Weight Bearing WBAT  LLE Weight Bearing WBAT  ADL  Toilet Transfer Simulated;+2 Total assistance  Toilet Transfer: Patient Percentage 60%  Toilet Transfer Method Stand pivot  Transfers/Ambulation Related to ADLs Pt transferred to chair with total A+2 (Pt. ~60%).    ADL Comments Assisted pt. back to bed.  Requiers verbal cues for walker sequence and placement.  Pain continues to be limiting factor for ADLs   Cognition  Overall Cognitive Status Appears within functional limits for tasks assessed/performed  Arousal/Alertness Awake/alert  Orientation Level Appears intact for tasks assessed  Behavior During Session [facility] for tasks performed  Cognition - Other Comments No obvious cognitive deficits;however, spoke with CNA, who reports pt is refusing bath and has done so for three days.  Bed Mobility  Bed Mobility Sit to Supine  Sit to Supine 1: +2 Total assist;HOB flat  Sit to Supine: Patient Percentage 30%  Scooting to HOB 1: +2 Total assist  Scooting to [facility]: Patient Percentage 20%  Details for Bed Mobility Assistance +2 (A) to slowly descend trunk into bed and elevate LE into bed with max cues for technique.  Transfers  Transfers Sit to Stand;Stand to Sit  Sit to Stand 1: +2 Total assist;From bed  Sit to Stand: Patient Percentage 60%  Stand to Sit 1: +2 Total assist;To chair/3-in-1  Stand to Sit: Patient Percentage 60%  Details for Transfer Assistance Pt. requires increased time to move sit to stand, and step by step cues to push through arms and to pivot feet/take step.  Assist required to move walker  Balance  Balance Assessed Yes  Static Sitting Balance  Static Sitting - Balance Support Feet supported  Static  Sitting - Level of Assistance 4: Min assist;5: Stand by assistance  Static Sitting - Comment/# of Minutes Initial min (A) to maintain balance due to posterior lean  OT - End of Session  Equipment Utilized During Treatment Gait belt  Activity Tolerance Patient limited by pain  Patient left in chair;with call bell/phone within reach;with family/visitor present  Nurse Communication Mobility status  OT Assessment/Plan  Comments on Treatment Session Pain is limiting factor for ADLs at this time.  Pt. is motivated.  Recommend CIR  OT Plan Discharge plan remains appropriate  OT Frequency Min 2X/week  Follow Up Recommendations Inpatient Rehab;Supervision/Assistance - 24 hour  Equipment Recommended Defer to next venue  ADL Goals  ADL Goal: Toilet Transfer - Progress Progressing toward goals    Jeani Hawking, OTR/L [phone]

## 2012-06-06 NOTE — Progress Notes (Signed)
Received call from Moyock from [place].  She was requesting rehab notes on this patient in order to approve inpatient rehab transfer.  I printed and faxed the PT/OT notes to her as requested and gave her the Rehab Admission coordinator, B. Boyette's, name and phone number to call the determination.  Left voicemail for BBoyette to advise her of this.   Trula Ore at [place] Phone: [phone] Fax: [phone]

## 2012-06-06 NOTE — Progress Notes (Signed)
We plan admit to [facility] tomorrow with insurance approval. [phone]

## 2012-06-06 NOTE — Progress Notes (Signed)
I met with patient and Mom at bedside to discuss inpt rehab venue. I have begun authorization with insurance for admit to CIR tomorrow. I also contacted admitting to notify them of third insurance policy. Please call me at [phone] with questions.
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
## 2012-06-06 NOTE — Progress Notes (Signed)
Complained of numbness and tingling in right foot, cms (+) wiggles toes, denies any pain, Dr. Magnus Ivan notified

## 2012-06-07 ENCOUNTER — Inpatient Hospital Stay (HOSPITAL_COMMUNITY)
Admission: RE | Admit: 2012-06-07 | Discharge: 2012-06-15 | DRG: 945 | Disposition: A | Discharge status: Home / Self Care | Payer: No Typology Code available for payment source | Source: Ambulatory Visit | Attending: Physical Medicine & Rehabilitation | Admitting: Physical Medicine & Rehabilitation

## 2012-06-07 DIAGNOSIS — S3210XA Unspecified fracture of sacrum, initial encounter for closed fracture: Secondary | ICD-10-CM | POA: Diagnosis present

## 2012-06-07 DIAGNOSIS — S32519A Fracture of superior rim of unspecified pubis, initial encounter for closed fracture: Secondary | ICD-10-CM | POA: Diagnosis present

## 2012-06-07 DIAGNOSIS — S060X9A Concussion with loss of consciousness of unspecified duration, initial encounter: Secondary | ICD-10-CM | POA: Diagnosis present

## 2012-06-07 DIAGNOSIS — S069X0A Unspecified intracranial injury without loss of consciousness, initial encounter: Secondary | ICD-10-CM | POA: Diagnosis present

## 2012-06-07 DIAGNOSIS — Z5189 Encounter for other specified aftercare: Secondary | ICD-10-CM

## 2012-06-07 DIAGNOSIS — S32409A Unspecified fracture of unspecified acetabulum, initial encounter for closed fracture: Secondary | ICD-10-CM | POA: Diagnosis present

## 2012-06-07 DIAGNOSIS — S322XXA Fracture of coccyx, initial encounter for closed fracture: Secondary | ICD-10-CM | POA: Diagnosis present

## 2012-06-07 DIAGNOSIS — T1490XA Injury, unspecified, initial encounter: Secondary | ICD-10-CM

## 2012-06-07 DIAGNOSIS — IMO0002 Reserved for concepts with insufficient information to code with codable children: Secondary | ICD-10-CM

## 2012-06-07 DIAGNOSIS — F988 Other specified behavioral and emotional disorders with onset usually occurring in childhood and adolescence: Secondary | ICD-10-CM | POA: Insufficient documentation

## 2012-06-07 DIAGNOSIS — S32599A Other specified fracture of unspecified pubis, initial encounter for closed fracture: Secondary | ICD-10-CM

## 2012-06-07 DIAGNOSIS — F909 Attention-deficit hyperactivity disorder, unspecified type: Secondary | ICD-10-CM | POA: Diagnosis present

## 2012-06-07 DIAGNOSIS — F419 Anxiety disorder, unspecified: Secondary | ICD-10-CM | POA: Insufficient documentation

## 2012-06-07 DIAGNOSIS — T07XXXA Unspecified multiple injuries, initial encounter: Secondary | ICD-10-CM | POA: Diagnosis present

## 2012-06-07 DIAGNOSIS — S329XXA Fracture of unspecified parts of lumbosacral spine and pelvis, initial encounter for closed fracture: Secondary | ICD-10-CM

## 2012-06-07 DIAGNOSIS — S060XAA Concussion with loss of consciousness status unknown, initial encounter: Secondary | ICD-10-CM

## 2012-06-07 DIAGNOSIS — F3289 Other specified depressive episodes: Secondary | ICD-10-CM | POA: Diagnosis present

## 2012-06-07 DIAGNOSIS — S32509A Unspecified fracture of unspecified pubis, initial encounter for closed fracture: Secondary | ICD-10-CM | POA: Diagnosis present

## 2012-06-07 DIAGNOSIS — K5900 Constipation, unspecified: Secondary | ICD-10-CM | POA: Diagnosis present

## 2012-06-07 DIAGNOSIS — F329 Major depressive disorder, single episode, unspecified: Secondary | ICD-10-CM | POA: Diagnosis present

## 2012-06-07 DIAGNOSIS — F411 Generalized anxiety disorder: Secondary | ICD-10-CM | POA: Diagnosis present

## 2012-06-07 HISTORY — DX: Unspecified fracture of sacrum, initial encounter for closed fracture: S32.10XA

## 2012-06-07 HISTORY — DX: Fracture of superior rim of unspecified pubis, initial encounter for closed fracture: S32.519A

## 2012-06-07 HISTORY — DX: Concussion with loss of consciousness status unknown, initial encounter: S06.0XAA

## 2012-06-07 HISTORY — DX: Concussion with loss of consciousness of unspecified duration, initial encounter: S06.0X9A

## 2012-06-07 HISTORY — DX: Other specified fracture of unspecified pubis, initial encounter for closed fracture: S32.599A

## 2012-06-07 MED ORDER — POLYETHYLENE GLYCOL 3350 17 G PO PACK: 17.0000 g | PACK | Freq: Every day | ORAL | Status: DC

## 2012-06-07 MED ORDER — ENOXAPARIN SODIUM 40 MG/0.4ML ~~LOC~~ SOLN: 40.0000 mg | SUBCUTANEOUS | Status: DC

## 2012-06-07 MED ORDER — LISDEXAMFETAMINE DIMESYLATE 50 MG PO CAPS: 50.0000 mg | ORAL_CAPSULE | Freq: Every day | ORAL | Status: DC

## 2012-06-07 MED ORDER — POLYETHYLENE GLYCOL 3350 17 G PO PACK
17.0000 g | PACK | Freq: Every day | ORAL | Status: DC
  Administered 2012-06-08 – 2012-06-15 (×8): 17 g via ORAL
  Filled 2012-06-07 (×10): qty 1

## 2012-06-07 MED ORDER — ONDANSETRON HCL 4 MG/2ML IJ SOLN: 4.0000 mg | Freq: Four times a day (QID) | INTRAMUSCULAR | Status: DC | PRN

## 2012-06-07 MED ORDER — OXYCODONE HCL 5 MG PO TABS: 10.0000 mg | ORAL_TABLET | ORAL | Status: DC | PRN

## 2012-06-07 MED ORDER — METHOCARBAMOL 500 MG PO TABS: 500.0000 mg | ORAL_TABLET | Freq: Four times a day (QID) | ORAL | Status: DC | PRN

## 2012-06-07 MED ORDER — OXYCODONE HCL 5 MG PO TABS
5.0000 mg | ORAL_TABLET | ORAL | Status: DC | PRN
  Administered 2012-06-07 (×2): 15 mg via ORAL
  Filled 2012-06-07 (×2): qty 3

## 2012-06-07 MED ORDER — ACETAMINOPHEN 325 MG PO TABS
325.0000 mg | ORAL_TABLET | ORAL | Status: DC | PRN
  Administered 2012-06-07 – 2012-06-08 (×2): 650 mg via ORAL
  Filled 2012-06-07 (×3): qty 2

## 2012-06-07 MED ORDER — FLUOXETINE HCL 20 MG PO CAPS: 40.0000 mg | ORAL_CAPSULE | Freq: Every day | ORAL | Status: DC

## 2012-06-07 MED ORDER — SORBITOL 70 % SOLN: 30.0000 mL | Freq: Every day | Status: DC | PRN

## 2012-06-07 MED ORDER — METHOCARBAMOL 500 MG PO TABS
500.0000 mg | ORAL_TABLET | Freq: Four times a day (QID) | ORAL | Status: DC | PRN
  Administered 2012-06-07 – 2012-06-10 (×7): 500 mg via ORAL
  Filled 2012-06-07 (×8): qty 1

## 2012-06-07 MED ORDER — ACETAMINOPHEN 325 MG PO TABS: 325.0000 mg | ORAL_TABLET | ORAL | Status: DC | PRN

## 2012-06-07 MED ORDER — ONDANSETRON HCL 4 MG PO TABS: 4.0000 mg | ORAL_TABLET | Freq: Four times a day (QID) | ORAL | Status: DC | PRN

## 2012-06-07 MED ORDER — BISACODYL 5 MG PO TBEC
5.0000 mg | DELAYED_RELEASE_TABLET | Freq: Every day | ORAL | Status: DC
  Administered 2012-06-07: 5 mg via ORAL
  Filled 2012-06-07: qty 1

## 2012-06-07 MED ORDER — FLUOXETINE HCL 40 MG PO CAPS
40.0000 mg | ORAL_CAPSULE | Freq: Every day | ORAL | Status: DC
  Administered 2012-06-07: 40 mg via ORAL
  Filled 2012-06-07 (×2): qty 1

## 2012-06-07 MED ORDER — SENNOSIDES-DOCUSATE SODIUM 8.6-50 MG PO TABS
1.0000 | ORAL_TABLET | Freq: Every evening | ORAL | Status: DC | PRN
  Administered 2012-06-08 – 2012-06-09 (×2): 1 via ORAL
  Filled 2012-06-07 (×2): qty 1

## 2012-06-07 MED ORDER — ENOXAPARIN SODIUM 40 MG/0.4ML ~~LOC~~ SOLN
40.0000 mg | SUBCUTANEOUS | Status: DC
  Administered 2012-06-07 – 2012-06-13 (×7): 40 mg via SUBCUTANEOUS
  Filled 2012-06-07 (×9): qty 0.4

## 2012-06-07 MED ORDER — FLUOXETINE HCL 20 MG PO CAPS
40.0000 mg | ORAL_CAPSULE | Freq: Every day | ORAL | Status: DC
  Administered 2012-06-08 – 2012-06-15 (×8): 40 mg via ORAL
  Filled 2012-06-07 (×10): qty 2

## 2012-06-07 MED ORDER — DOCUSATE SODIUM 100 MG PO CAPS
200.0000 mg | ORAL_CAPSULE | Freq: Two times a day (BID) | ORAL | Status: DC
  Administered 2012-06-07: 200 mg via ORAL

## 2012-06-07 MED ORDER — SORBITOL 70 % SOLN
30.0000 mL | Freq: Every day | Status: DC | PRN
  Administered 2012-06-08 – 2012-06-09 (×2): 30 mL via ORAL
  Filled 2012-06-07 (×2): qty 30

## 2012-06-07 MED ORDER — OXYCODONE HCL 5 MG PO TABS
15.0000 mg | ORAL_TABLET | ORAL | Status: DC | PRN
  Administered 2012-06-07 – 2012-06-14 (×33): 15 mg via ORAL
  Filled 2012-06-07 (×5): qty 3
  Filled 2012-06-07: qty 1
  Filled 2012-06-07 (×5): qty 3
  Filled 2012-06-07: qty 1
  Filled 2012-06-07 (×22): qty 3
  Filled 2012-06-07: qty 2
  Filled 2012-06-07: qty 3

## 2012-06-07 MED ORDER — HYDROMORPHONE HCL PF 1 MG/ML IJ SOLN: 0.5000 mg | INTRAMUSCULAR | Status: DC | PRN
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
## 2012-06-07 NOTE — Progress Notes (Signed)
Patient ID: Morgan Braun, female   DOB: 1991/08/19, 21 y.o.   MRN: [id number]   LOS: 4 days   Subjective: No c/o.  Objective: Vital signs in last 24 hours: Temp:  [98.3 F (36.8 C)-98.7 F (37.1 C)] 98.5 F (36.9 C) (08/16 0500) Pulse Rate:  [79-90] 86  (08/16 0500) Resp:  [16-18] 18  (08/16 0500) BP: (112-115)/(65-86) 115/86 mmHg (08/16 0500) SpO2:  [99 %-100 %] 99 % (08/16 0500) Last BM Date: 06/03/12   General appearance: alert and no distress Resp: clear to auscultation bilaterally Cardio: regular rate and rhythm GI: normal findings: bowel sounds normal and soft, non-tender Extremities: NVI, pulses intact and =   Assessment/Plan: PHBC  Pelvic Fx L rami into acetabulum - WBAT with therapies  Abrasions back and BLE - local care  Tingling symptoms RLE - Resolved FEN -- Increase bowel regimen VTE - Lovenox, SCD's Dispo - Hopefully CIR today if insurance approves    Freeman Caldron, PA-C Pager: [phone] General Trauma PA Pager: [phone]   06/07/2012
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
## 2012-06-07 NOTE — Progress Notes (Signed)
Insurance has approved and Patient to be admitted to CIR today. Patient and Mom are aware and in agreement. Please call me with any questions. [phone]

## 2012-06-07 NOTE — Progress Notes (Signed)
Patient admitted to [id number] accompanied by her mother. Patient and mother oriented to room, bed controls, white board, therapy schedule, meal time, team conference, education booklet for general rehab, safety plan, safety agreement, declined to watch safety video, have assessment done or to transfer to bed at this time. Requested to just be left alone to rest . Will attempt again at later time to complete admission paper work and head to toe assessment. Please see CHL for details of assessment. Roberts-VonCannon, Morgan Braun Morgan Braun

## 2012-06-07 NOTE — Progress Notes (Signed)
Physical Therapy Treatment Patient Details Name: Morgan Braun MRN: [id number] DOB: 08-09-1991 Today's Date: 06/07/2012 Time: 5284-1324 PT Time Calculation (min): 49 min  PT Assessment / Plan / Recommendation Comments on Treatment Session  Pt able to increase ambulation distance and increase weight bear thru left LE with ambulation.  Pt having difficulty advancing left LE.  Pt to d/c to inpatient rehab today.     Follow Up Recommendations  Inpatient Rehab    Barriers to Discharge        Equipment Recommendations  Defer to next venue    Recommendations for Other Services    Frequency Min 5X/week   Plan Discharge plan remains appropriate;Frequency remains appropriate    Precautions / Restrictions Precautions Precautions: Fall Restrictions Weight Bearing Restrictions: Yes RLE Weight Bearing: Weight bearing as tolerated LLE Weight Bearing: Weight bearing as tolerated   Pertinent Vitals/Pain 6/10 bilateral LE hip pain left > right    Mobility  Bed Mobility Bed Mobility: Sit to Supine Supine to Sit: 1: +2 Total assist;HOB flat Supine to Sit: Patient Percentage: 50% Sitting - Scoot to Edge of Bed: 3: Mod assist Details for Bed Mobility Assistance: +2 (A) elevate trunk OOB with max cues to initiate LE and UE to assist with transfer Transfers Transfers: Sit to Stand;Stand to Sit Sit to Stand: 1: +2 Total assist;From bed Sit to Stand: Patient Percentage: 60% Stand to Sit: 1: +2 Total assist;To chair/3-in-1 Stand to Sit: Patient Percentage: 60% Stand Pivot Transfers: 3: Mod assist Details for Transfer Assistance: (A) to initiate transfer with max cues for hand placement.   Ambulation/Gait Ambulation/Gait Assistance: 3: Mod assist Ambulation Distance (Feet): 8 Feet Assistive device: Rolling walker Ambulation/Gait Assistance Details: (A) to maintain balance with max cues for step sequence and proper weight shift.  Pt able to increase weight bearing through left LE however pt  c/o more pain in left LE than right LE. Gait Pattern: Step-to pattern;Decreased stride length;Shuffle;Antalgic;Trunk flexed    Exercises     PT Diagnosis:    PT Problem List:   PT Treatment Interventions:     PT Goals Acute Rehab PT Goals PT Goal Formulation: With patient/family Time For Goal Achievement: 06/11/12 Potential to Achieve Goals: Good Pt will go Supine/Side to Sit: with min assist PT Goal: Supine/Side to Sit - Progress: Progressing toward goal Pt will Sit at Edge of Bed: with modified independence;3-5 min PT Goal: Sit at Edge Of Bed - Progress: Progressing toward goal Pt will go Sit to Supine/Side: with min assist PT Goal: Sit to Supine/Side - Progress: Progressing toward goal Pt will go Sit to Stand: with min assist PT Goal: Sit to Stand - Progress: Progressing toward goal Pt will go Stand to Sit: with min assist PT Goal: Stand to Sit - Progress: Progressing toward goal Pt will Transfer Bed to Chair/Chair to Bed: with min assist PT Transfer Goal: Bed to Chair/Chair to Bed - Progress: Progressing toward goal Pt will Stand: with supervision;3 - 5 min PT Goal: Stand - Progress: Progressing toward goal Pt will Ambulate: 16 - 50 feet;with min assist;with rolling walker PT Goal: Ambulate - Progress: Progressing toward goal  Visit Information  Last PT Received On: 06/07/12 Assistance Needed: +2    Subjective Data  Subjective: "Pain much better today."   Cognition  Overall Cognitive Status: Appears within functional limits for tasks assessed/performed Arousal/Alertness: Awake/alert Orientation Level: Appears intact for tasks assessed Behavior During Session: [facility] for tasks performed    Balance  Balance Balance Assessed: Yes Static  Sitting Balance Static Sitting - Balance Support: Feet supported Static Sitting - Level of Assistance: 4: Min assist;5: Stand by assistance Static Sitting - Comment/# of Minutes: Pt sat EOB for ~15 minutes while RN and PT assist in  changing wound dressings on pt's back  End of Session PT - End of Session Equipment Utilized During Treatment: Gait belt Activity Tolerance: Patient limited by pain Patient left: in chair;with call bell/phone within reach;with family/visitor present;with nursing in room Nurse Communication: Mobility status   GP     Kathee Tumlin 06/07/2012, 2:52 PM Jake Shark, PT DPT [phone]
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
## 2012-06-07 NOTE — Clinical Social Work Note (Signed)
Clinical Social Worker following patient for support and discharge planning needs.  Patient states that her plan is for [facility] with a discharge date of today.  Patient currently refuses SBIRT assessment at this time refusing to have mother leave the room.    Clinical Social Worker will sign off for now as social work intervention is no longer needed. Please consult [place] again if new need arises.  Macario Golds, [place] [phone]

## 2012-06-08 ENCOUNTER — Inpatient Hospital Stay (HOSPITAL_COMMUNITY): Payer: No Typology Code available for payment source | Admitting: Physical Therapy

## 2012-06-08 ENCOUNTER — Inpatient Hospital Stay (HOSPITAL_COMMUNITY): Payer: No Typology Code available for payment source | Admitting: Speech Pathology

## 2012-06-08 ENCOUNTER — Inpatient Hospital Stay (HOSPITAL_COMMUNITY): Payer: No Typology Code available for payment source

## 2012-06-08 ENCOUNTER — Inpatient Hospital Stay (HOSPITAL_COMMUNITY): Payer: No Typology Code available for payment source | Admitting: *Deleted

## 2012-06-08 DIAGNOSIS — S329XXA Fracture of unspecified parts of lumbosacral spine and pelvis, initial encounter for closed fracture: Secondary | ICD-10-CM

## 2012-06-08 DIAGNOSIS — Z5189 Encounter for other specified aftercare: Secondary | ICD-10-CM

## 2012-06-08 DIAGNOSIS — IMO0002 Reserved for concepts with insufficient information to code with codable children: Secondary | ICD-10-CM
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
## 2012-06-08 NOTE — Progress Notes (Signed)
Pts last BM noted to be 06/03/12, laxative was offered and declined on previous shift. Attempted to offer suppository again this morning, pt and her mother declined stating she hasn't been eating well the last few days anyway. Reinforced education regarding need for regular bowel pattern and not going 2+ days without BM, pt agreed to take senna 1 tab PRN. Also on scheduled colace and miralax. Will monitor for results. Mick Sell, RN

## 2012-06-08 NOTE — Plan of Care (Signed)
Problem: RH BOWEL ELIMINATION Goal: RH STG MANAGE BOWEL WITH ASSISTANCE STG Manage Bowel with Assistance.Modified independence  Outcome: Not Progressing Refused laxative. Last BM 06/03/2012.

## 2012-06-08 NOTE — Plan of Care (Signed)
Problem: RH BLADDER ELIMINATION Goal: RH STG MANAGE BLADDER WITH ASSISTANCE STG Manage Bladder With Assistance.Modified independence  Outcome: Progressing Patient uses bedside commode.
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
## 2012-06-08 NOTE — Plan of Care (Signed)
Problem: RH BOWEL ELIMINATION Goal: RH STG MANAGE BOWEL W/MEDICATION W/ASSISTANCE STG Manage Bowel with Medication with Assistance.modifed independence  Outcome: Not Progressing Refused laxative. LBM 06/03/2012.

## 2012-06-09 ENCOUNTER — Inpatient Hospital Stay (HOSPITAL_COMMUNITY): Payer: No Typology Code available for payment source | Admitting: *Deleted

## 2012-06-09 MED ORDER — BISACODYL 10 MG RE SUPP
10.0000 mg | Freq: Every day | RECTAL | Status: DC | PRN
  Administered 2012-06-09: 10 mg via RECTAL
  Filled 2012-06-09: qty 1
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
## 2012-06-09 NOTE — Progress Notes (Signed)
Occupational Therapy Note  Patient Details  Name: Morgan Braun MRN: [id number] Date of Birth: [birth year] Today's Date: 06/09/2012 Time:  1300-1345  (45 min) Pain:  Increased to 6/10 with activity.  SEE MAR Individual Session   Engaged in functional mobility, transer to toilet and shower, standing balance, sitting balance.  Pt. Used RW to ambulate to toilet with min. Assist.  She needed cues for hand placement when going from sit to stand and vice versa.  She ambulated to shower and was able to stand for 5 minutes during her bathing.  She balance  During shower with minimal assist while demonstrating good upright control.  She dressed UB with supervision and lower body with max assist.  Pt.'s mother present and was happy to see how well pt was doing.    Humberto Seals 06/09/2012, 3:14 PM
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
## 2012-06-10 ENCOUNTER — Inpatient Hospital Stay (HOSPITAL_COMMUNITY): Payer: No Typology Code available for payment source | Admitting: Speech Pathology

## 2012-06-10 ENCOUNTER — Inpatient Hospital Stay (HOSPITAL_COMMUNITY): Payer: No Typology Code available for payment source | Admitting: Occupational Therapy

## 2012-06-10 ENCOUNTER — Inpatient Hospital Stay (HOSPITAL_COMMUNITY): Payer: No Typology Code available for payment source

## 2012-06-10 ENCOUNTER — Inpatient Hospital Stay (HOSPITAL_COMMUNITY): Payer: No Typology Code available for payment source | Admitting: Physical Therapy

## 2012-06-10 DIAGNOSIS — Z5189 Encounter for other specified aftercare: Secondary | ICD-10-CM

## 2012-06-10 DIAGNOSIS — S329XXA Fracture of unspecified parts of lumbosacral spine and pelvis, initial encounter for closed fracture: Secondary | ICD-10-CM

## 2012-06-10 DIAGNOSIS — IMO0002 Reserved for concepts with insufficient information to code with codable children: Secondary | ICD-10-CM

## 2012-06-10 LAB — COMPREHENSIVE METABOLIC PANEL
BUN: 9 mg/dL (ref 6–23)
Calcium: 9.8 mg/dL (ref 8.4–10.5)
GFR calc Af Amer: >90 mL/min (ref >90)
Glucose, Bld: 99 mg/dL (ref 70–99)
Total Protein: 6.8 g/dL (ref 6.0–8.3)

## 2012-06-10 LAB — CBC WITH DIFFERENTIAL/PLATELET
Basophils Absolute: 0 10*3/uL (ref 0.0–0.1)
HCT: 31.6 % — ABNORMAL LOW (ref 36.0–46.0)
Hemoglobin: 10.7 g/dL — ABNORMAL LOW (ref 12.0–15.0)
Lymphocytes Relative: 23 % (ref 12–46)
Monocytes Absolute: 0.7 10*3/uL (ref 0.1–1.0)
Monocytes Relative: 11 % (ref 3–12)
Neutro Abs: 4.3 10*3/uL (ref 1.7–7.7)
RDW: 11.9 % (ref 11.5–15.5)
WBC: 6.9 10*3/uL (ref 4.0–10.5)

## 2012-06-10 MED ORDER — ENSURE COMPLETE PO LIQD
237.0000 mL | ORAL | Status: DC
  Administered 2012-06-13 – 2012-06-14 (×2): 237 mL via ORAL

## 2012-06-10 MED ORDER — SENNOSIDES-DOCUSATE SODIUM 8.6-50 MG PO TABS
1.0000 | ORAL_TABLET | Freq: Every day | ORAL | Status: DC
  Administered 2012-06-11 – 2012-06-13 (×3): 1 via ORAL
  Filled 2012-06-10 (×4): qty 1
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
## 2012-06-10 NOTE — Progress Notes (Signed)
Physical Therapy Session Note  Patient Details  Name: Morgan Braun MRN: [id number] Date of Birth: 04/05/1991  Today's Date: 06/10/2012 Time: 0910-1005 Time Calculation (min): 55 min  Second Treatment:  13:00-13:48 Time:  48 min  Short Term Goals: Week 1:  PT Short Term Goal 1 (Week 1): Patient will be a ble to perform bed mobility with min-Assist PT Short Term Goal 2 (Week 1): Patient will be able to perform Transfers with min-Assist PT Short Term Goal 3 (Week 1): Patient will be able to ambulate 10' using LRAD and S/Mod-Inedpendnet assist. PT Short Term Goal 4 (Week 1): Patient will be able to ascend/descend 3, 6" steps with Mod-Assist.  Skilled Therapeutic Interventions/Progress Updates:   First Session:  Pt c/o pain in R ankle, MD ordered x-ray and pt was TDWB during that session per MD pending x-ray.  Pt's mobility was significantly limited due to pain.  Discussed at length with pt and father about layout of home and how pt would get to school. Second Session:  Pt's R ankle cleared, x-ray= tissue swelling and continue WBAT.  Pt was significantly different from am, had gotten a nap and was able to gait with RW 50+' with supervision. Question some mild cognitive deficits vs. Effects of pain med and decreased amounts of sleep.  Pt having some difficulty remembering steps to setting up w/c for transfers. See below for mobility details.   Therapy Documentation Precautions:  Precautions Precautions: Fall Restrictions Weight Bearing Restrictions: Yes RLE Weight Bearing: Weight bearing as tolerated LLE Weight Bearing: Weight bearing as tolerated Pain: First session, pain not rated but limiting pt's ability to perform tasks that required wt bearing through LEs. Pain Assessment Pain Score:   2 Pain Type: Acute pain Pain Location: Foot Pain Orientation: Right Pain Descriptors: Aching;Discomfort Pain Intervention(s): Medication (See eMAR) Mobility:  Squat/scoot pivot transfers  initially with mod@ then progressing to min@ by end of second session, w/c to mat and w/c to arm chair (at raised height).  Supine to sit with supervision and decreased speed. Locomotion :  W/c propulsion on unit with UEs x 150 x 2 including carpet, with decreased speed and supervision.  Gait during second session with RW, 8', 28' and 31' with RW and close supervision.  Pt even stopping twice and standing without UE support. Other Treatments:   Use of ipad during a rest break to assess pt's ability to use device to assess cognitive function.  Pt able to use without difficulty. See FIM for current functional status  Therapy/Group: Individual Therapy  Georges Mouse 06/10/2012, 3:21 PM

## 2012-06-10 NOTE — Progress Notes (Signed)
Speech Language Pathology Daily Session Note  Patient Details  Name: Morgan Braun MRN: [id number] Date of Birth: 1991/02/06  Today's Date: 06/10/2012 Time: 1400-1430 Time Calculation (min): 30 min  Short Term Goals: Week 1: SLP Short Term Goal 1 (Week 1): Patient will participate in further diagnostic testing of high-level cognitive function.  Skilled Therapeutic Interventions: Treatment focus on diagnostic treatment for higher level cognitive function. Pt required supervision question cues and extra time to recall morning events, steps for setting up her wheelchair and anticipatory awareness for getting to school once back at home. Pt would benefit from continued skilled SLP intervention to maximize working memory and overall independence.    FIM:  Comprehension Comprehension Mode: Auditory Comprehension: 6-Follows complex conversation/direction: With extra time/assistive device Expression Expression Mode: Verbal Expression: 6-Expresses complex ideas: With extra time/assistive device Social Interaction Social Interaction: 6-Interacts appropriately with others with medication or extra time (anti-anxiety, antidepressant). Problem Solving Problem Solving: 5-Solves complex 90% of the time/cues < 10% of the time Memory Memory: 5-Recognizes or recalls 90% of the time/requires cueing < 10% of the time  Pain Pain Assessment Pain Assessment: No/denies pain Pain Score:   2 Pain Type: Acute pain Pain Location: Foot Pain Orientation: Right Pain Descriptors: Aching;Discomfort Pain Intervention(s): Medication (See eMAR)  Therapy/Group: Individual Therapy  Tritia Endo 06/10/2012, 4:33 PM

## 2012-06-10 NOTE — Progress Notes (Signed)
Occupational Therapy Session Note  Patient Details  Name: Morgan Braun MRN: [id number] Date of Birth: 1991-08-05  Today's Date: 06/10/2012 Time: 0805-0900 Time Calculation (min): 55 min  Short Term Goals: Week 1:  OT Short Term Goal 1 (Week 1): Pt. will be supervision with LB bathing  OT Short Term Goal 2 (Week 1): Pt. will be supervision with LB dressing OT Short Term Goal 3 (Week 1): Pt. will be supervison with toilet transfer OT Short Term Goal 4 (Week 1): Pt. will be supervison with shower transfer  Skilled Therapeutic Interventions/Progress Updates:   Pt seen for BADL retraining of toileting, bathing, and dressing with a focus on sit to stand, functional mobility with RW, and standing balance. Pt only required steady assist to ambulate into bathroom, but then needed min assist from toilet to shower as pt was having some leg drag on LLE.  She was very concerned with swelling in RLE, pt was provided with knee high TEDs and nursing informed. Pt has difficulty reaching to feet for LB bathing and dressing. Encouraging active movement versus AE at this time.        Therapy Documentation Precautions:  Precautions Precautions: Fall Restrictions Weight Bearing Restrictions: Yes RLE Weight Bearing: Weight bearing as tolerated LLE Weight Bearing: Weight bearing as tolerated   Pain: Pain Assessment Pain Assessment: 0-10 Pain Score:   3 Pain Type: Acute pain Pain Location: Pelvis (right foot) Pain Orientation: Left Pain Descriptors: Aching;Discomfort Pain Frequency: Intermittent Pain Onset: Gradual Patients Stated Pain Goal: 2 Pain Intervention(s): Medication (See eMAR)  See FIM for current functional status  Therapy/Group: Individual Therapy  Kelvyn Schunk 06/10/2012, 11:54 AM

## 2012-06-10 NOTE — Progress Notes (Signed)
Patient information reviewed and entered into [facility] system by Danielle Lento, RN, CRRN, PPS Coordinator.  Information including medical coding and functional independence measure will be reviewed and updated through discharge.    
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
## 2012-06-11 ENCOUNTER — Inpatient Hospital Stay (HOSPITAL_COMMUNITY): Payer: No Typology Code available for payment source | Admitting: Physical Therapy

## 2012-06-11 ENCOUNTER — Inpatient Hospital Stay (HOSPITAL_COMMUNITY): Payer: No Typology Code available for payment source | Admitting: Speech Pathology

## 2012-06-11 ENCOUNTER — Inpatient Hospital Stay (HOSPITAL_COMMUNITY): Payer: No Typology Code available for payment source | Admitting: Occupational Therapy

## 2012-06-11 MED ORDER — MELOXICAM 15 MG PO TABS
15.0000 mg | ORAL_TABLET | Freq: Every day | ORAL | Status: DC
  Administered 2012-06-11 – 2012-06-15 (×5): 15 mg via ORAL
  Filled 2012-06-11 (×6): qty 1

## 2012-06-11 NOTE — Progress Notes (Signed)
Occupational Therapy Session Note  Patient Details  Name: Morgan Braun MRN: [id number] Date of Birth: Feb 27, 1991  Today's Date: 06/11/2012 Time: 0800-0900 Time Calculation (min): 60 min  Short Term Goals: Week 1:  OT Short Term Goal 1 (Week 1): Pt. will be supervision with LB bathing  OT Short Term Goal 2 (Week 1): Pt. will be supervision with LB dressing OT Short Term Goal 3 (Week 1): Pt. will be supervison with toilet transfer OT Short Term Goal 4 (Week 1): Pt. will be supervison with shower transfer  Skilled Therapeutic Interventions/Progress Updates:   Pt seen for BADL retraining of toileting, bathing, and dressing with a focus on sit to stand, standing balance, and functional mobilty with RW.  She was not in pain, but had some pain with moving in and out of shower.  She was more independent with bathing and dressing today.  Needed assist to don socks. Pt will most likely be using a walk in shower at her mother's home, but a tub is available.     Therapy Documentation Precautions:  Precautions Precautions: Fall Restrictions Weight Bearing Restrictions: Yes RLE Weight Bearing: Weight bearing as tolerated LLE Weight Bearing: Weight bearing as tolerated   Pain: Pain Assessment Pain Assessment: No/denies pain Pain Score: 0-No pain Faces Pain Scale: No hurt  See FIM for current functional status  Therapy/Group: Individual Therapy  SAGUIER,JULIA 06/11/2012, 11:26 AM

## 2012-06-11 NOTE — Progress Notes (Signed)
Recreational Therapy Session Note  Patient Details  Name: Jannetta Massey MRN: [id number] Date of Birth: [birth year] Today's Date: 06/11/2012 Time: 1478-2956 Pain: 2/10 LE's Skilled Therapeutic Interventions/Progress Updates: Pt stood for ball toss while naming items in a category (divided attention) task with contact guard assist (Min Assist).  Discussed community reintegration/outing to be scheduled for tomorrow, pt agreeable.  Therapy/Group: Co-Treatment  Arron Mcnaught 06/11/2012, 12:31 PM

## 2012-06-11 NOTE — Progress Notes (Signed)
Physical Therapy Session Note  Patient Details  Name: Morgan Braun MRN: [id number] Date of Birth: [birth year]  Today's Date: 06/11/2012 Time:  - 1000-1055 (55 minutes) individual    Short Term Goals: Week 1:  PT Short Term Goal 1 (Week 1): Patient will be a ble to perform bed mobility with min-Assist PT Short Term Goal 2 (Week 1): Patient will be able to perform Transfers with min-Assist PT Short Term Goal 3 (Week 1): Patient will be able to ambulate 53' using LRAD and S/Mod-Inedpendnet assist. PT Short Term Goal 4 (Week 1): Patient will be able to ascend/descend 3, 6" steps with Mod-Assist.  Skilled Therapeutic Interventions/Progress Updates: Bed mobility training; bilateral AROM/strengthening exercises in supine; gait training/endurance; standing tolerance without AD     Therapy Documentation Precautions:  Precautions Precautions: Fall Restrictions Weight Bearing Restrictions: Yes RLE Weight Bearing: Weight bearing as tolerated LLE Weight Bearing: Weight bearing as tolerated General:   Vital Signs: Therapy Vitals Temp: 98.3 F (36.8 C) Temp src: Oral Pulse Rate: 77  Resp: 18  BP: 100/64 mmHg Patient Position, if appropriate: Lying Oxygen Therapy SpO2: 99 % O2 Device: None (Room air) Pain: 2/10 LEs Pain Assessment Pain Score: Asleep Mobility: Transfers stand/pivot RW SBA WBAT bilateral LEs (pt performing TDWB RT LE secondary to ankle discomfort (awaiting ASO) ; sit to supine (mat) min assist LT LE; supine to sit   min assist LT LE and vcs for technique (pushing up)  Locomotion : wc mobility- SBA unit 150 feet, level, controlled environment      Balance: Standing- pt performed standing activity ( tossing ball and naming items in grocery store) close SBA with weightbearing > on LT LE 2 X 5 minutes. No loss of balance noted   Exercises: Bilateral heel slides (AA on left), SAQs, ankle pumps X 20    1400-1440 (40 minutes) individual Pain - 2/10 Rt  LE/premedicated Focus of treatment: Gait training/endurance WBAT bilateral LEs with RW, bed mobility training to facilitate independence; bilateral LE AROM/strengthening Treatment: WC mobility SBA unit (150 feet ) ; gait 60 feet X 1 RW with vcs for heel/toe gait pattern on RT - close SBA; transfers with RW SBA ; sit to supine (standard bed) using sheet to assist LEs onto bed min assist; bilateral heel slides X 15; supine to sit SBA  Using sheet LEs min assist .; standing with RW hip flexion RT/LT LEs to facilitate increased step height.  See FIM for current functional status  Therapy/Group: Individual Therapy  Kemesha Mosey,JIM 06/11/2012, 7:58 AM
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
## 2012-06-11 NOTE — Progress Notes (Signed)
Speech Language Pathology Daily Session Note  Patient Details  Name: Morgan Braun MRN: [id number] Date of Birth: January 16, 1991  Today's Date: 06/11/2012 Time: 1100-1135 Time Calculation (min): 35 min  Short Term Goals: Week 1: SLP Short Term Goal 1 (Week 1): Patient will participate in further diagnostic testing of high-level cognitive function.  Skilled Therapeutic Interventions: Treatment focus on complex reading comprehension at the paragraph level. Pt answered multiple choice questions and generated original open-ended answers with Mod I with extra time. Pt also able to self-monitor and correct decreased attention to task with Mod I.    FIM:  Comprehension Comprehension Mode: Auditory Comprehension: 6-Follows complex conversation/direction: With extra time/assistive device Expression Expression Mode: Verbal Expression: 6-Expresses complex ideas: With extra time/assistive device Social Interaction Social Interaction: 6-Interacts appropriately with others with medication or extra time (anti-anxiety, antidepressant). Problem Solving Problem Solving: 6-Solves complex problems: With extra time Memory Memory: 6-More than reasonable amt of time  Pain Pain Assessment Pain Assessment: No/denies pain Pain Score: 0-No pain Faces Pain Scale: No hurt  Therapy/Group: Individual Therapy  Arnaldo Heffron 06/11/2012, 2:47 PM

## 2012-06-11 NOTE — Progress Notes (Signed)
Orthopedic Tech Progress Note Patient Details:  Morgan Braun 1991-03-03 [id number]  Patient ID: [name], female   DOB: Sep 17, 1991, 21 y.o.   MRN: [id number]   [name] 06/11/2012, 11:00 AM CALLED JEFF SMITH FROM [facility] FOR RIGHT ASO

## 2012-06-11 NOTE — Progress Notes (Signed)
[facility]patient [facility] Individual Statement of Services  Patient Name:  Morgan Braun  Date:  06/11/2012  Welcome to the [facility]patient [facility].  Our goal is to provide you with an individualized program based on your diagnosis and situation, designed to meet your specific needs.  With this comprehensive rehabilitation program, you will be expected to participate in at least 3 hours of rehabilitation therapies Monday-Friday, with modified therapy programming on the weekends.  Your rehabilitation program will include the following services:  Physical Therapy (PT), Occupational Therapy (OT), Speech Therapy (ST), 24 hour per day rehabilitation nursing, Therapeutic Recreaction (TR), Neuropsychology, Case Management (Social Worker), Rehabilitation Medicine, Nutrition Services and Pharmacy Services  Weekly team conferences will be held on Tuesdays to discuss your progress.  Your  Social Worker will talk with you frequently to get your input and to update you on team discussions.  Team conferences with you and your family in attendance may also be held.  Expected length of stay: 10 days Overall anticipated outcome: supervision to modified independent  Depending on your progress and recovery, your program may change.  Your  Social Worker will coordinate services and will keep you informed of any changes.  Your  Social Worker's name and contact numbers are listed  below.  The following services may also be recommended but are not provided by the Inpatient [facility] Center:   Driving Evaluations  Home Health Rehabiltiation Services  Outpatient Rehabilitatation [facility]  Vocational Rehabilitation   Arrangements will be made to provide these services after discharge if needed.  Arrangements include referral to agencies that provide these services.  Your insurance has been verified to be:  Community education officer and GHI Your primary doctor is:  Dr. Darien Ramus Dellorusso ([address] [place],  [place])  Pertinent information will be shared with your doctor and your insurance company.    Social Worker:  Amada Jupiter, [place] [phone] or [phone]  Information discussed with and copy given to patient by: Amada Jupiter, 06/11/2012, 11:31 AM

## 2012-06-12 ENCOUNTER — Inpatient Hospital Stay (HOSPITAL_COMMUNITY): Payer: Managed Care, Other (non HMO) | Admitting: Physical Therapy

## 2012-06-12 ENCOUNTER — Inpatient Hospital Stay (HOSPITAL_COMMUNITY): Payer: No Typology Code available for payment source | Admitting: Occupational Therapy

## 2012-06-12 ENCOUNTER — Inpatient Hospital Stay (HOSPITAL_COMMUNITY): Payer: No Typology Code available for payment source | Admitting: Physical Therapy

## 2012-06-12 NOTE — Progress Notes (Signed)
Orthopedic Tech Progress Note Patient Details:  Morgan Braun 07/05/1991 [id number]  Patient ID: Morgan Braun, female   DOB: 18-May-1991, 21 y.o.   MRN: [id number] Brace order completed by advanced  Nikki Dom 06/12/2012, 2:13 PM

## 2012-06-12 NOTE — Progress Notes (Signed)
Occupational Therapy Session Note  Patient Details  Name: Morgan Braun MRN: [id number] Date of Birth: 1991/10/01  Today's Date: 06/12/2012 Time: 0905-1000 Time Calculation (min): 55 min  Short Term Goals: Week 1:  OT Short Term Goal 1 (Week 1): Pt. will be supervision with LB bathing  OT Short Term Goal 2 (Week 1): Pt. will be supervision with LB dressing OT Short Term Goal 3 (Week 1): Pt. will be supervison with toilet transfer OT Short Term Goal 4 (Week 1): Pt. will be supervison with shower transfer  Skilled Therapeutic Interventions/Progress Updates:      Pt seen for BADL retraining of toileting, bathing, and dressing with a focus on functional mobility with RW, reaching to feet for LB dressing.  Pt was able to tolerate more activity today.  She only required supervision, except for assist with donning socks and shoes.  Cleared patient's mom, Byrd Hesselbach, to take her to the bathroom with RW.  Therapy Documentation Precautions:  Precautions Precautions: Fall Restrictions Weight Bearing Restrictions: Yes RLE Weight Bearing: Weight bearing as tolerated LLE Weight Bearing: Weight bearing as tolerated   Pain: Pain Assessment Pain Assessment: No/denies pain Pain Score: 0-No pain Faces Pain Scale: No hurt  See FIM for current functional status  Therapy/Group: Individual Therapy  SAGUIER,JULIA 06/12/2012, 12:19 PM

## 2012-06-12 NOTE — Progress Notes (Signed)
Speech Language Pathology Discharge Summary  Patient Details  Name: Morgan Braun MRN: [id number] Date of Birth: 10-22-1991  Today's Date: 06/12/2012  Patient has met 1 of 1 long term goals.  Patient to discharge at overall Independent- Modified Independent level.   Reasons goals not met: N/A   Clinical Impression/Discharge Summary: Pt has met all LTG's this admission. Currently, pt is overall Independent-Mod I for complex higher level cognitive skills. Both the pt and her parents report pt is at cognitive baseline. Pt and family education complete. Pt will be discharged from skilled SLP intervention and does not need any follow-up at this time.   Care Partner:  Caregiver Able to Provide Assistance: Yes  Type of Caregiver Assistance: Cognitive;Physical  Recommendation:  None      Equipment: N/A   Reasons for discharge: Treatment goals met   Patient/Family Agrees with Progress Made and Goals Achieved: Yes   See FIM for current functional status  Taunia Frasco 06/12/2012, 8:05 AM
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
## 2012-06-12 NOTE — Progress Notes (Signed)
Recreational Therapy Session Note  Patient Details  Name: Morgan Braun MRN: [id number] Date of Birth: 1991/01/19 Today's Date: 06/12/2012 Time:  1030-1300 Pain: c/o pelvic and foot pain at end of outing upon return to hospital, unrated but requested pain meds once back to room- RN made aware Skilled Therapeutic Interventions/Progress Updates: Pt participated in community reintegration/outing to [employer] ambulatory level using RW on even/uneven community surfaces with supervision- distant supervision. Discussion focused on energy conservation techniques and identification and negotiation of obstacles.  Therapy/Group: Community Reintegration  Level of assist: Supervision  [name] 06/12/2012, 4:42 PM

## 2012-06-12 NOTE — Progress Notes (Addendum)
Physical Therapy Session Note  Patient Details  Name: Morgan Braun MRN: [id number] Date of Birth: 03-15-1991  Today's Date: 06/12/2012 Time: 1030-1300 Time Calculation (min): 150 min  Short Term Goals: Week 1:  PT Short Term Goal 1 (Week 1): Patient will be a ble to perform bed mobility with min-Assist PT Short Term Goal 2 (Week 1): Patient will be able to perform Transfers with min-Assist PT Short Term Goal 3 (Week 1): Patient will be able to ambulate 44' using LRAD and S/Mod-Inedpendnet assist. PT Short Term Goal 4 (Week 1): Patient will be able to ascend/descend 3, 6" steps with Mod-Assist.  Skilled Therapeutic Interventions/Progress Updates:   Pt participated in community outing for lunch.  On outing pt used her RW during the whole outing performing gait in the restaurant around obstacles and outside on different inclines with supervision to mod I.  Pt gaits with decreased speed due to pelvic and foot pain.  Not rated but by end of outing pt stated she thought she needed pain meds, RN notified.  Pt performed all transfers with supervision to mod I.  Cognitive task performed requiring pt to remember the order of another pt to place both her and other pt's order.  Performed without difficulty.  Pt performed toileting in public bathroom with RW and mod I. Pt descended curb with RW with close supervision.  Spoke with SW and PA about decreasing pt's LOS based upon performance on outing.    Therapy Documentation Precautions:  Precautions Precautions: Fall Restrictions Weight Bearing Restrictions: Yes RLE Weight Bearing: Weight bearing as tolerated LLE Weight Bearing: Weight bearing as tolerated General: Amount of Missed PT Time (min): 45 Minutes Missed Time Reason: Patient fatigue (Just returned from outing with fatigue and increased pain) Pain:  See above See FIM for current functional status  Therapy/Group: Co-Treatment with TR for community reintegration.  Georges Mouse 06/12/2012, 1:41 PM

## 2012-06-13 ENCOUNTER — Inpatient Hospital Stay (HOSPITAL_COMMUNITY): Payer: Managed Care, Other (non HMO) | Admitting: Physical Therapy

## 2012-06-13 ENCOUNTER — Inpatient Hospital Stay (HOSPITAL_COMMUNITY): Payer: No Typology Code available for payment source | Admitting: Occupational Therapy

## 2012-06-13 ENCOUNTER — Encounter (HOSPITAL_COMMUNITY): Payer: Managed Care, Other (non HMO)

## 2012-06-13 DIAGNOSIS — S32509A Unspecified fracture of unspecified pubis, initial encounter for closed fracture: Secondary | ICD-10-CM

## 2012-06-13 NOTE — Progress Notes (Signed)
Social Work Patient ID: Morgan Braun, female   DOB: 16-Sep-1991, 21 y.o.   MRN: [id number]  Met with patient and parents following team conference to review information. Both parents pleased with progress and all are aware and agreeable with targeted d/c 8/28.  Plan to remain in [place] initially, therefore, will arrange f/u therapies locally to start.  Kenidee Cregan
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
## 2012-06-13 NOTE — Progress Notes (Signed)
Social Work Patient ID: Morgan Braun, female   DOB: [birth year], 21 y.o.   MRN: [id number]  Team recommending that d/c date be changed to 8/24 as pt making excellent progress.  Insurance CM has approved stay through this date.  Will arrange DME and f/u therapy.  Karima Carrell

## 2012-06-13 NOTE — Progress Notes (Signed)
Occupational Therapy Session Note  Patient Details  Name: Morgan Braun MRN: [id number] Date of Birth: February 18, 1991  Today's Date: 06/13/2012 Time: 0905-1005 Time Calculation (min): 60 min  Short Term Goals: Week 1:  OT Short Term Goal 1 (Week 1): Pt. will be supervision with LB bathing  OT Short Term Goal 2 (Week 1): Pt. will be supervision with LB dressing OT Short Term Goal 3 (Week 1): Pt. will be supervison with toilet transfer OT Short Term Goal 4 (Week 1): Pt. will be supervison with shower transfer  Skilled Therapeutic Interventions/Progress Updates: Pt seen for functional mobility training with RW, ambulating to adl apartment, ambulating in bathroom with mod I, practicing tub vs. Shower transfers.  Prior to therapy, pt completed ADLs with her mother providing supervision.  Pt able to use tub bench with supervision and shower stall transfer with min assist.  They both feel the tub bench is the safest method.  Pt has a tendency to push walker too far forward, worked on smooth gait pattern with RW just as a support.      Therapy Documentation Precautions:  Precautions Precautions: Fall Restrictions Weight Bearing Restrictions: Yes RLE Weight Bearing: Weight bearing as tolerated LLE Weight Bearing: Weight bearing as tolerated   Pain: Pain Assessment Pain Assessment: 0-10 Pain Score:   2 Pain Type: Acute pain Pain Location: Pelvis Pain Orientation: Left Pain Descriptors: Aching Pain Frequency: Intermittent Pain Onset: On-going Patients Stated Pain Goal: 0 Pain Intervention(s): RN made aware  See FIM for current functional status  Therapy/Group: Individual Therapy  Allecia Bells 06/13/2012, 12:18 PM

## 2012-06-13 NOTE — Progress Notes (Signed)
Pitting edema 2 + noted on the right foot. Instructed to elevate the leg. Ice pack given. Patient and patient's mother requested to put dry dressing to the open area of the  back of the neck (nape) towards the right side to avoid skin irritation and infection. To remove the dressing early in the morning.

## 2012-06-13 NOTE — Progress Notes (Addendum)
Physical Therapy Session Note  Patient Details  Name: Yuleidy Rappleye MRN: [id number] Date of Birth: June 06, 1991  Today's Date: 06/13/2012 Time: 1002-1058 Time Calculation (min): 56 min  Second Treatment:  13:05-13:30 Time:  25 min  Third Treatment:  15:00-15:43 Time 43 min  Short Term Goals: Week 1:  PT Short Term Goal 1 (Week 1): Patient will be a ble to perform bed mobility with min-Assist PT Short Term Goal 2 (Week 1): Patient will be able to perform Transfers with min-Assist PT Short Term Goal 3 (Week 1): Patient will be able to ambulate 11' using LRAD and S/Mod-Inedpendnet assist. PT Short Term Goal 4 (Week 1): Patient will be able to ascend/descend 3, 6" steps with Mod-Assist.  Skilled Therapeutic Interventions/Progress Updates:   Pt seen for 3 treatment sessions today.  Mom present for part of the first session and watched video of pt's performance that she could not watch.  During second session pain became an issue for the pt (same time as 8/21).  Pt made mod I in room with RW. Pt needs to review and practice steps with rail and curb with RW and needs a HEP to address stretching and ROM and she will be ready for d/c. Therapy Documentation Precautions:  Precautions Precautions: Fall Restrictions Weight Bearing Restrictions: Yes RLE Weight Bearing: Weight bearing as tolerated LLE Weight Bearing: Weight bearing as tolerated Pain: Pain Assessment Pain Score:   2 Pain Location: Leg Pain Orientation: Left Pain Descriptors: Sore Pain Frequency: Intermittent Pain Intervention(s): Medication (See eMAR) Mobility:  All transfers mod I.  Car transfer with RW with supervision. Locomotion :  1st:  Step training outside with L rail simulating steps at home with mod I.  Performed 5 steps x2. Curb step with RW with close supervision x 2 2nd:  Gait on unit with RW x 120' x 2 mod I.  Exercises:  2nd:  Supine exercises for stretching and ROM, pt performed 10 reps of hip  AB/ADduction and heel slides, bilaterally. Other Treatments:   3rd: Took pt to gift shop to negotiate obstacles and narrow areas with RW.  Pt up performing gait and dynamic balance with both UEs free while looking at objects for 30 min without rest break without difficulty.  Overall, supervision to mod I.  See FIM for current functional status  Therapy/Group: Individual Therapy  Georges Mouse 06/13/2012, 2:47 PM

## 2012-06-14 ENCOUNTER — Inpatient Hospital Stay (HOSPITAL_COMMUNITY): Payer: No Typology Code available for payment source | Admitting: Occupational Therapy

## 2012-06-14 ENCOUNTER — Inpatient Hospital Stay (HOSPITAL_COMMUNITY): Payer: No Typology Code available for payment source | Admitting: *Deleted

## 2012-06-14 ENCOUNTER — Inpatient Hospital Stay (HOSPITAL_COMMUNITY): Payer: No Typology Code available for payment source

## 2012-06-14 MED ORDER — METHOCARBAMOL 500 MG PO TABS: 500.0000 mg | ORAL_TABLET | Freq: Four times a day (QID) | ORAL | Status: AC | PRN

## 2012-06-14 MED ORDER — MELOXICAM 15 MG PO TABS: 15.0000 mg | ORAL_TABLET | Freq: Every day | ORAL | Status: AC

## 2012-06-14 MED ORDER — OXYCODONE HCL 15 MG PO TABS: 15.0000 mg | ORAL_TABLET | ORAL | Status: AC | PRN

## 2012-06-14 NOTE — Discharge Summary (Signed)
  Discharge summary job [id number]
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
## 2012-06-14 NOTE — Progress Notes (Signed)
Occupational Therapy Discharge Summary  Patient Details  Name: Morgan Braun MRN: [id number] Date of Birth: Aug 11, 1991  Today's Date: 06/14/2012  Patient has met 8 of 8 long term goals due to improved activity tolerance and improved balance.  Patient to discharge at overall Modified Independent level.  Patient's care partner is independent to provide the necessary physical assistance at discharge.    Reasons goals not met: n/a  Recommendation:  No further OT recommended. Equipment: transfer tub bench  Reasons for discharge: treatment goals met  Patient/family agrees with progress made and goals achieved: Yes  OT Discharge Precautions/Restrictions  Precautions Precautions: Fall Restrictions Weight Bearing Restrictions: No RLE Weight Bearing: Weight bearing as tolerated LLE Weight Bearing: Weight bearing as tolerated Pain Pain Assessment Pain Assessment: No/denies pain Pain Score:   5 Pain Location: Leg Pain Orientation: Left Pain Descriptors: Aching Pain Frequency: Intermittent Pain Onset: On-going Patients Stated Pain Goal: 2 Pain Intervention(s): Medication (See eMAR) (15mg  oxy) ADL - mod I, except for supervision with tub bench transfer and simple meal prep   Vision/Perception  Vision - Assessment Eye Alignment: Within Functional Limits Perception Perception: Within Functional Limits Praxis Praxis: Intact  Cognition Overall Cognitive Status: Appears within functional limits for tasks assessed Orientation Level: Oriented X4 Sensation Sensation Light Touch: Appears Intact Stereognosis: Appears Intact Hot/Cold: Appears Intact Proprioception: Appears Intact Coordination Gross Motor Movements are Fluid and Coordinated: Yes Fine Motor Movements are Fluid and Coordinated: Yes Motor  Motor Motor: Within Functional Limits Mobility  Bed Mobility Supine to Sit: 7: Independent Sitting - Scoot to Edge of Bed: 7: Independent Sit to Supine: 7:  Independent Transfers Sit to Stand: 6: Modified independent (Device/Increase time);With upper extremity assist Stand to Sit: 6: Modified independent (Device/Increase time);With armrests;With upper extremity assist  Trunk/Postural Assessment  Cervical Assessment Cervical Assessment: Within Functional Limits Thoracic Assessment Thoracic Assessment: Within Functional Limits Lumbar Assessment Lumbar Assessment: Within Functional Limits Postural Control Postural Control: Within Functional Limits  Balance Static Sitting Balance Static Sitting - Level of Assistance: 7: Independent Extremity/Trunk Assessment RUE Assessment RUE Assessment: Within Functional Limits LUE Assessment LUE Assessment: Within Functional Limits  See FIM for current functional status  SAGUIER,JULIA 06/14/2012, 11:57 AM  Pt seen for OT session from 1330-1415 and ready for discharge.  Ardis Rowan, COTA/L 06/15/12 1436

## 2012-06-14 NOTE — Progress Notes (Signed)
Occupational Therapy Note  Patient Details  Name: Corliss Coggeshall MRN: [id number] Date of Birth: 02-29-1991 Today's Date: 06/14/2012  Time: 1330-1415 Pt denies pain Individual therapy  Pt engaged in functional amb with RW on uneven surfaces, stepping over objects, up/down inclines, and navigating cluttered environments.  Pt exhibited no unsafe behaviors and requested rest breaks appropriately.  Pt scheduled to discharge home with Mom on 8/24.   Lavone Neri [facility] 06/14/2012, 2:32 PM

## 2012-06-14 NOTE — Progress Notes (Signed)
Occupational Therapy Session Note  Patient Details  Name: Morgan Braun MRN: [id number] Date of Birth: 1991-03-21  Today's Date: 06/14/2012 Time: 1000-1100 Time Calculation (min): 60 min  Short Term Goals: Week 1:  OT Short Term Goal 1 (Week 1): Pt. will be supervision with LB bathing  OT Short Term Goal 2 (Week 1): Pt. will be supervision with LB dressing OT Short Term Goal 3 (Week 1): Pt. will be supervison with toilet transfer OT Short Term Goal 4 (Week 1): Pt. will be supervison with shower transfer  Skilled Therapeutic Interventions/Progress Updates: Pt had completed all self care at a mod I level this am, with patient's mother only providing supervision for tub bench transfer.  Pt seen this session to address simple meal prep and kitchen mobility.  Pt practiced reaching to lower shelves and cupboards using staggered stance with UE support. Reviewed kitchen set up/ arrangement for safe strategies.  Pt was able to ambulate to and from room to adl apartment with RW without taking a break.  Once in the apartment she rested for a few minutes on the couch.       Therapy Documentation Precautions:  Precautions Precautions: Fall Restrictions Weight Bearing Restrictions: No RLE Weight Bearing: Weight bearing as tolerated LLE Weight Bearing: Weight bearing as tolerated    Pain: Pain Assessment Pain Assessment: No/denies pain Pain Score:   5 Pain Location: Leg Pain Orientation: Left Pain Descriptors: Aching Pain Frequency: Intermittent Pain Onset: On-going Patients Stated Pain Goal: 2 Pain Intervention(s): Medication (See eMAR) (15mg  oxy) See FIM for current functional status  Therapy/Group: Individual Therapy  SAGUIER,JULIA 06/14/2012, 12:00 PM
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
## 2012-06-14 NOTE — Progress Notes (Signed)
Physical Therapy Discharge Summary  Patient Details  Name: Morgan Braun MRN: [id number] Date of Birth: 1990/11/15  Today's Date: 06/14/2012 Time: 1415-1450 session 2   Session 1- 9:00-10:00  Patient has met 8 of 8 long term goals due to improved activity tolerance, improved balance, increased strength, increased range of motion, decreased pain, ability to compensate for deficits and functional use of  right lower extremity and left lower extremity.  Patient to discharge at an ambulatory level Modified Independent.  Pt has w/c for community use.   Patient's care partner is independent to provide the necessary physical assistance at discharge.  Reasons goals not met:  n/a  Recommendation:  Patient will benefit from ongoing skilled PT services in outpatient setting to continue to advance safe functional mobility, address ongoing impairments in LE strength and ROM, decrease need for gait device,increase gait velocity and minimize fall risk.  Equipment: w/c and RW  Reasons for discharge: treatment goals met  Patient/family agrees with progress made and goals achieved: Yes  PT Discharge Precautions/Restrictions  WBAT, falls Vital Signs 108 with gait   Vision/Perception  Vision - Assessment Eye Alignment: Within Functional Limits Perception Perception: Within Functional Limits Praxis Praxis: Intact  Cognition Overall Cognitive Status: Appears within functional limits for tasks assessed Sensation Sensation Light Touch: Appears Intact Stereognosis: Appears Intact Hot/Cold: Appears Intact Proprioception: Appears Intact Coordination Gross Motor Movements are Fluid and Coordinated: Yes Fine Motor Movements are Fluid and Coordinated: Yes Motor  Motor Motor: Within Functional Limits  Mobility  mod I transfers with RW Locomotion  Ambulation Ambulation/Gait Assistance: 6: Modified independent (Device/Increase time) Gait Gait velocity: .84 ft/sec High Level  Ambulation High Level Ambulation: Side stepping;Backwards walking;Direction changes Stairs / Additional Locomotion Stairs Assistance: 6: Modified independent (Device/Increase time) Wheelchair Mobility Distance: 200  Trunk/Postural Assessment  Cervical Assessment Cervical Assessment: Within Functional Limits Thoracic Assessment Thoracic Assessment: Within Functional Limits Lumbar Assessment Lumbar Assessment: Within Functional Limits Postural Control Postural Control: Within Functional Limits  Balance Static Sitting Balance Static Sitting - Level of Assistance: 7: Independent Extremity Assessment  RUE Assessment RUE Assessment: Within Functional Limits LUE Assessment LUE Assessment: Within Functional Limits  RLE stronger than L, knee WFL, hip limited by pain 3+/5 grossly; ankle swollen but strength WFL LLE hip 3-/5-3/5, knee and ankle WFL    Session 1 Premedicated, reports 2/10 pain in pelvis, no intervention needed :gait/ functional mobility training in household situations, curb, ramp and flight steps; pt able to balance with no UE assist and min perturbations; education on picking objects up off floor with RW  reeval  Session 2 : Pain 5/10 pelvis, requested and took medications  there-ex, pt given written HEP for LE strength and ROM in hip, theraband exercises for R ankle d/t sprain; performed all exercises 10 x  See FIM for current functional status  Michaelene Song 06/14/2012, 2:58 PM

## 2012-06-15 NOTE — Care Management Note (Signed)
    Page 1 of 1   06/15/2012     11:16:37 AM   CARE MANAGEMENT NOTE 06/15/2012  Patient:  Morgan Braun, Morgan Braun   Account Number:  [id number]  Date Initiated:  06/15/2012  Documentation initiated by:  [facility]  Subjective/Objective Assessment:     Action/Plan:   Anticipated DC Date:  06/15/2012   Anticipated DC Plan:  HOME/SELF CARE      DC Planning Services  CM consult      Choice offered to / List presented to:             Status of service:  Completed, signed off Medicare Important Message given?   (If response is "NO", the following Medicare IM given date fields will be blank) Date Medicare IM given:   Date Additional Medicare IM given:    Discharge Disposition:  HOME/SELF CARE  Per UR Regulation:    If discussed at Long Length of Stay Meetings, dates discussed:    Comments:  06/15/2012 1030 Provided pt with info on [facility] to pick up 3n1 from store. Unit RN will give order. Address and contact number on d/c instructions for pt to call. Isidoro Donning RN CCM Case Mgmt phone [phone]

## 2012-06-15 NOTE — Progress Notes (Signed)
Patient ID: Morgan Braun, female   DOB: [birth year], 21 y.o.   MRN: [id number] Patient ID: Morgan Braun, female   DOB: 30-Sep-1991, 21 y.o.   MRN: [id number]  Subjective/Complaints: Generally improving. Excited about dc today  Objective: Vital Signs: Blood pressure 95/62, pulse 97, temperature 98.5 F (36.9 C), temperature source Oral, resp. rate 20, weight 52.164 kg (115 lb), last menstrual period 05/29/2012, SpO2 97.00%. No results found. No results found for this or any previous visit (from the past 72 hour(s)).   HEENT: normal Cardio: RRR Resp: CTA B/L GI: BS positive Extremity:  No Edema Skin:   Other multiple abrasions midline thoracic, L scapular, L elbow, Bilat knuckles Neuro: Alert/Oriented and Anxious Musc/Skel:  Extremity tender R patellar, pain with L hip ROM   Assessment/Plan: 1. Functional deficits secondary to poly trauma pelvic fracture and mild TBI which require 3+ hours per day of interdisciplinary therapy in a comprehensive inpatient rehab setting. Physiatrist is providing close team supervision and 24 hour management of active medical problems listed below. Physiatrist and rehab team continue to assess barriers to discharge/monitor patient progress toward functional and medical goals.  Dc to home today. Ortho follow up. Call me with questions  FIM: FIM - Bathing Bathing Steps Patient Completed: Chest;Right Arm;Left Arm;Abdomen;Front perineal area;Buttocks;Right lower leg (including foot);Left upper leg;Left lower leg (including foot);Right upper leg Bathing: 6: More than reasonable amount of time  FIM - Upper Body Dressing/Undressing Upper body dressing/undressing steps patient completed: Thread/unthread right bra strap;Thread/unthread left bra strap;Thread/unthread right sleeve of pullover shirt/dresss;Hook/unhook bra;Thread/unthread left sleeve of pullover shirt/dress;Put head through opening of pull over shirt/dress;Pull shirt over trunk Upper body  dressing/undressing: 7: Complete Independence: No helper FIM - Lower Body Dressing/Undressing Lower body dressing/undressing steps patient completed: Thread/unthread right underwear leg;Thread/unthread left underwear leg;Pull underwear up/down;Thread/unthread right pants leg;Thread/unthread left pants leg;Pull pants up/down;Fasten/unfasten pants;Don/Doff right sock;Don/Doff left sock;Don/Doff right shoe;Fasten/unfasten right shoe;Fasten/unfasten left shoe;Don/Doff left shoe Lower body dressing/undressing: 6: More than reasonable amount of time  FIM - Toileting Toileting steps completed by patient: Adjust clothing prior to toileting;Adjust clothing after toileting;Performs perineal hygiene Toileting Assistive Devices: Grab bar or rail for support Toileting: 6: More than reasonable amount of time  FIM - Diplomatic Services operational officer Devices: Art gallery manager Transfers: 6-More than reasonable amt of time  FIM - Banker Devices: Environmental consultant;Arm rests Bed/Chair Transfer: 7: Supine > Sit: No assist;7: Sit > Supine: No assist;6: Bed > Chair or W/C: No assist;6: Chair or W/C > Bed: No assist  FIM - Locomotion: Wheelchair Distance: 200 Locomotion: Wheelchair: 6: Travels 150 ft or more, turns around, maneuvers to table, bed or toilet, negotiates 3% grade: maneuvers on rugs and over door sills independently FIM - Locomotion: Ambulation Locomotion: Ambulation Assistive Devices: Designer, industrial/product Ambulation/Gait Assistance: 6: Modified independent (Device/Increase time) Locomotion: Ambulation: 6: Travels 150 ft or more with assistive device/no helper  Comprehension Comprehension Mode: Auditory Comprehension: 7-Follows complex conversation/direction: With no assist  Expression Expression Mode: Verbal Expression: 7-Expresses complex ideas: With no assist  Social Interaction Social Interaction: 6-Interacts appropriately with others with medication or  extra time (anti-anxiety, antidepressant).  Problem Solving Problem Solving: 6-Solves complex problems: With extra time  Memory Memory: 6-Assistive device: No helper  Medical Problem List and Plan:  1. Left superior and inferior pubic ramus fracture/right sacral fracture and bilateral L5 transverse process fracture 06/03/2012. Patient is weightbearing as tolerated with conservative care   - Likely ankle sprain  -added meloxicam  - ASO, Ice,  elevation, compression, elevation 2. DVT Prophylaxis/Anticoagulation: Subcutaneous Lovenox. Monitor platelet counts and any signs of bleeding. Venous Doppler studies 06/05/2012 negative  3. Pain Management: Oxycodone and Robaxin as needed. Monitor with increased mobility.  4. Mood. Resumed Prozac . Mood and spirits are good. Patient anxious about anticipated upcoming discharge. Provide emotional support and positive reinforcement  5. Neuropsych: This patient is capable of making decisions on his/her own behalf- cognition at baseline. 6.  R knee pain patellar tendon area  Seems improved 7. Constipation. Bowel program well regulated continue MiraLAX and Senokot as directed. Discussed bowel regimen once home.   LOS (Days) 8 A FACE TO FACE EVALUATION WAS PERFORMED  Chaos Carlile T 06/15/2012, 7:42 AM

## 2012-06-15 NOTE — Discharge Planning (Signed)
Patient discharged home in stable condition. Verbalizes understanding of all discharge instructions, including home medications and follow up appointments. 

## 2012-06-16 NOTE — Progress Notes (Signed)
Social Work  Discharge Note  The overall goal for the admission was met for:   Discharge location: Yes - Home with mother to provide assistance  Length of Stay: Yes - 8 days  Discharge activity level: Yes - supervision to modified independent  Home/community participation: Yes  Services provided included: MD, RD, PT, OT, SLP, RN, TR, Pharmacy, Neuropsych and SW  Financial Services: Private Insurance: [employer]  Follow-up services arranged: Outpatient: PT via [facility] [place], DME: 16x16 lightweight w/c via [employer]) and rolling walker via [employer] and Patient/Family has no preference for HH/DME agencies  Comments (or additional information):  Patient/Family verbalized understanding of follow-up arrangements: Yes  Individual responsible for coordination of the follow-up plan: mother  Confirmed correct DME delivered: [facility] Jupiter 06/16/2012    Emmett Arntz

## 2012-06-18 ENCOUNTER — Ambulatory Visit: Payer: No Typology Code available for payment source | Attending: Physical Medicine & Rehabilitation

## 2012-06-18 DIAGNOSIS — IMO0001 Reserved for inherently not codable concepts without codable children: Secondary | ICD-10-CM | POA: Insufficient documentation

## 2012-06-18 DIAGNOSIS — M6281 Muscle weakness (generalized): Secondary | ICD-10-CM | POA: Insufficient documentation

## 2012-06-18 DIAGNOSIS — R262 Difficulty in walking, not elsewhere classified: Secondary | ICD-10-CM | POA: Insufficient documentation

## 2012-06-18 DIAGNOSIS — R269 Unspecified abnormalities of gait and mobility: Secondary | ICD-10-CM | POA: Insufficient documentation

## 2012-06-19 ENCOUNTER — Ambulatory Visit: Payer: No Typology Code available for payment source

## 2012-06-19 NOTE — Plan of Care (Signed)
Problem: RH PAIN MANAGEMENT Goal: RH STG PAIN MANAGED AT OR BELOW PT'S PAIN GOAL 3 or less on scale of 1-10  Outcome: Not Met (add Reason) Pain managed at 5 or below but not at patients pain goal of 3 or less consistently at time of discharge 06/15/12.

## 2012-06-21 ENCOUNTER — Ambulatory Visit: Payer: No Typology Code available for payment source

## 2012-06-25 ENCOUNTER — Ambulatory Visit: Payer: No Typology Code available for payment source | Attending: Orthopedic Surgery | Admitting: Physical Therapy

## 2012-06-25 DIAGNOSIS — M6281 Muscle weakness (generalized): Secondary | ICD-10-CM | POA: Insufficient documentation

## 2012-06-25 DIAGNOSIS — R269 Unspecified abnormalities of gait and mobility: Secondary | ICD-10-CM | POA: Insufficient documentation

## 2012-06-25 DIAGNOSIS — IMO0001 Reserved for inherently not codable concepts without codable children: Secondary | ICD-10-CM | POA: Insufficient documentation

## 2012-06-25 DIAGNOSIS — R262 Difficulty in walking, not elsewhere classified: Secondary | ICD-10-CM | POA: Insufficient documentation

## 2013-02-12 IMAGING — CR DG ANKLE COMPLETE 3+V*R*
3 series · 3 of 3 positions shown · non-contrast
Comparison: None.

CLINICAL DATA: Posterior right ankle pain and swelling with limited
range of motion.

RIGHT ANKLE - COMPLETE 3+ VIEW

[view not recorded (1 of 3)]
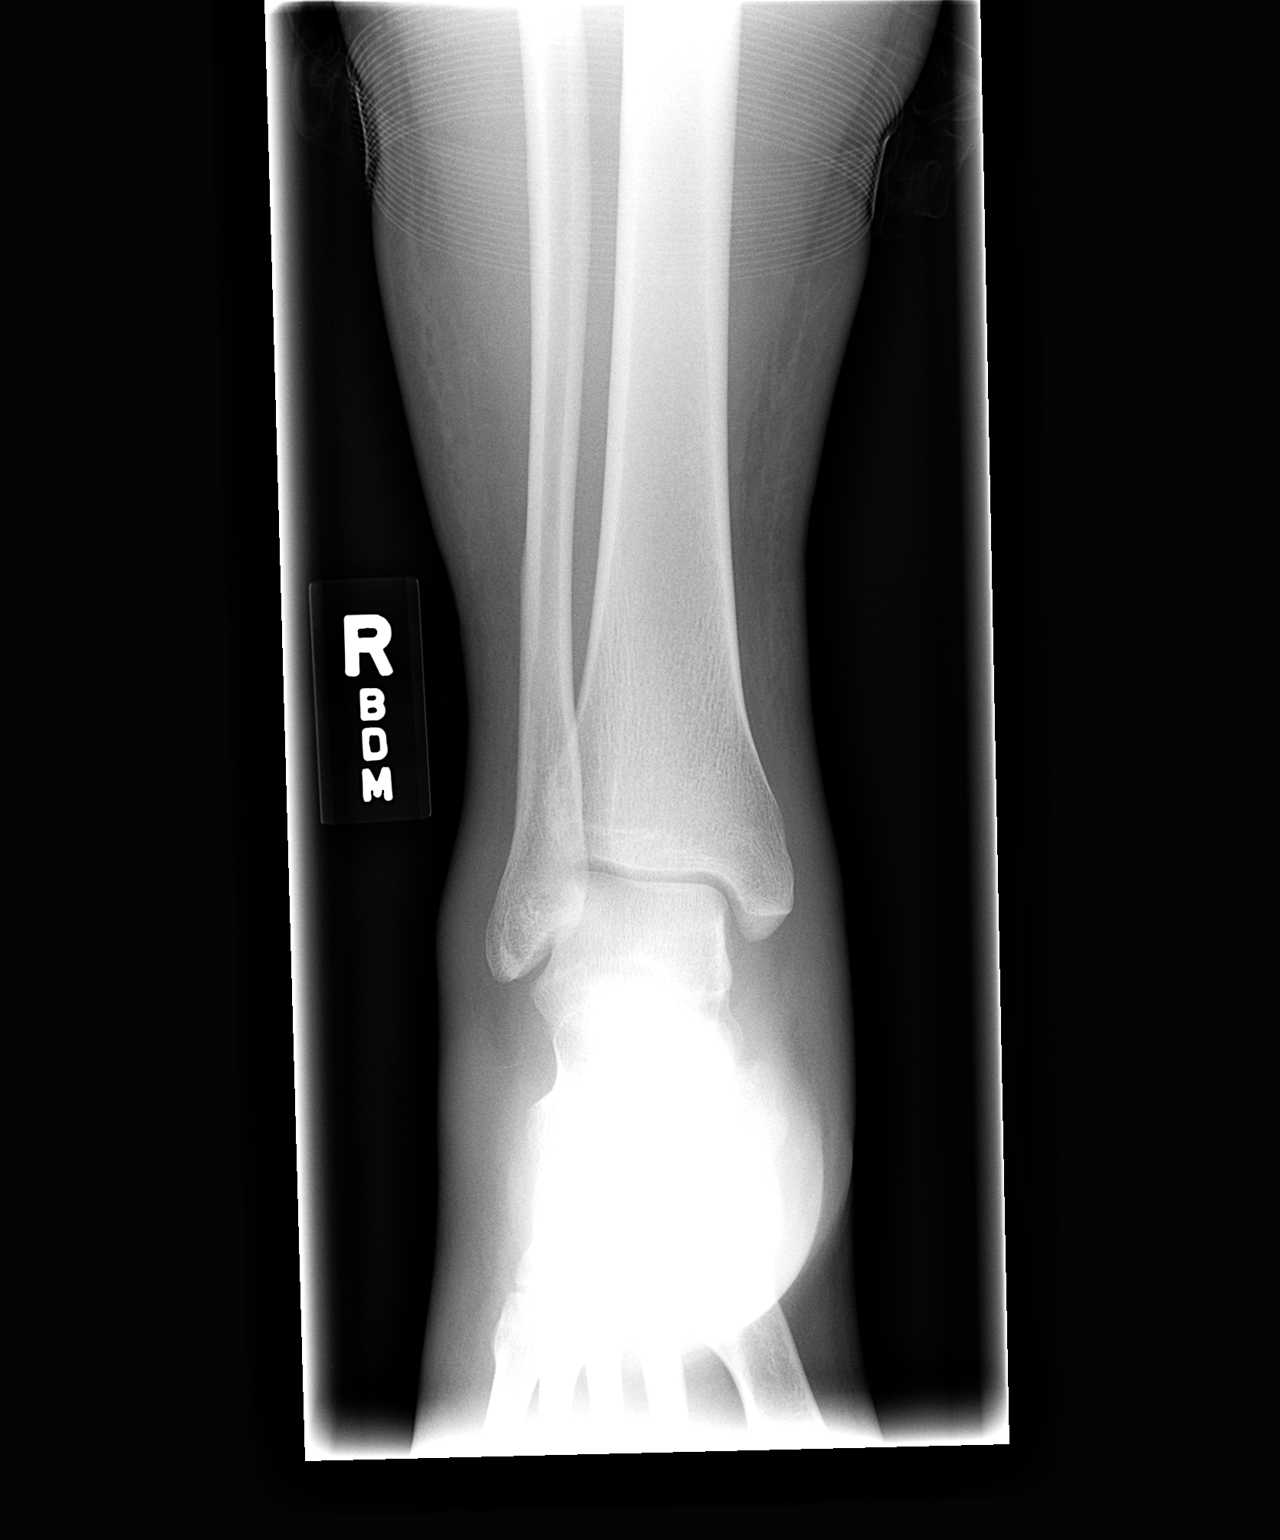

[view not recorded (2 of 3)]
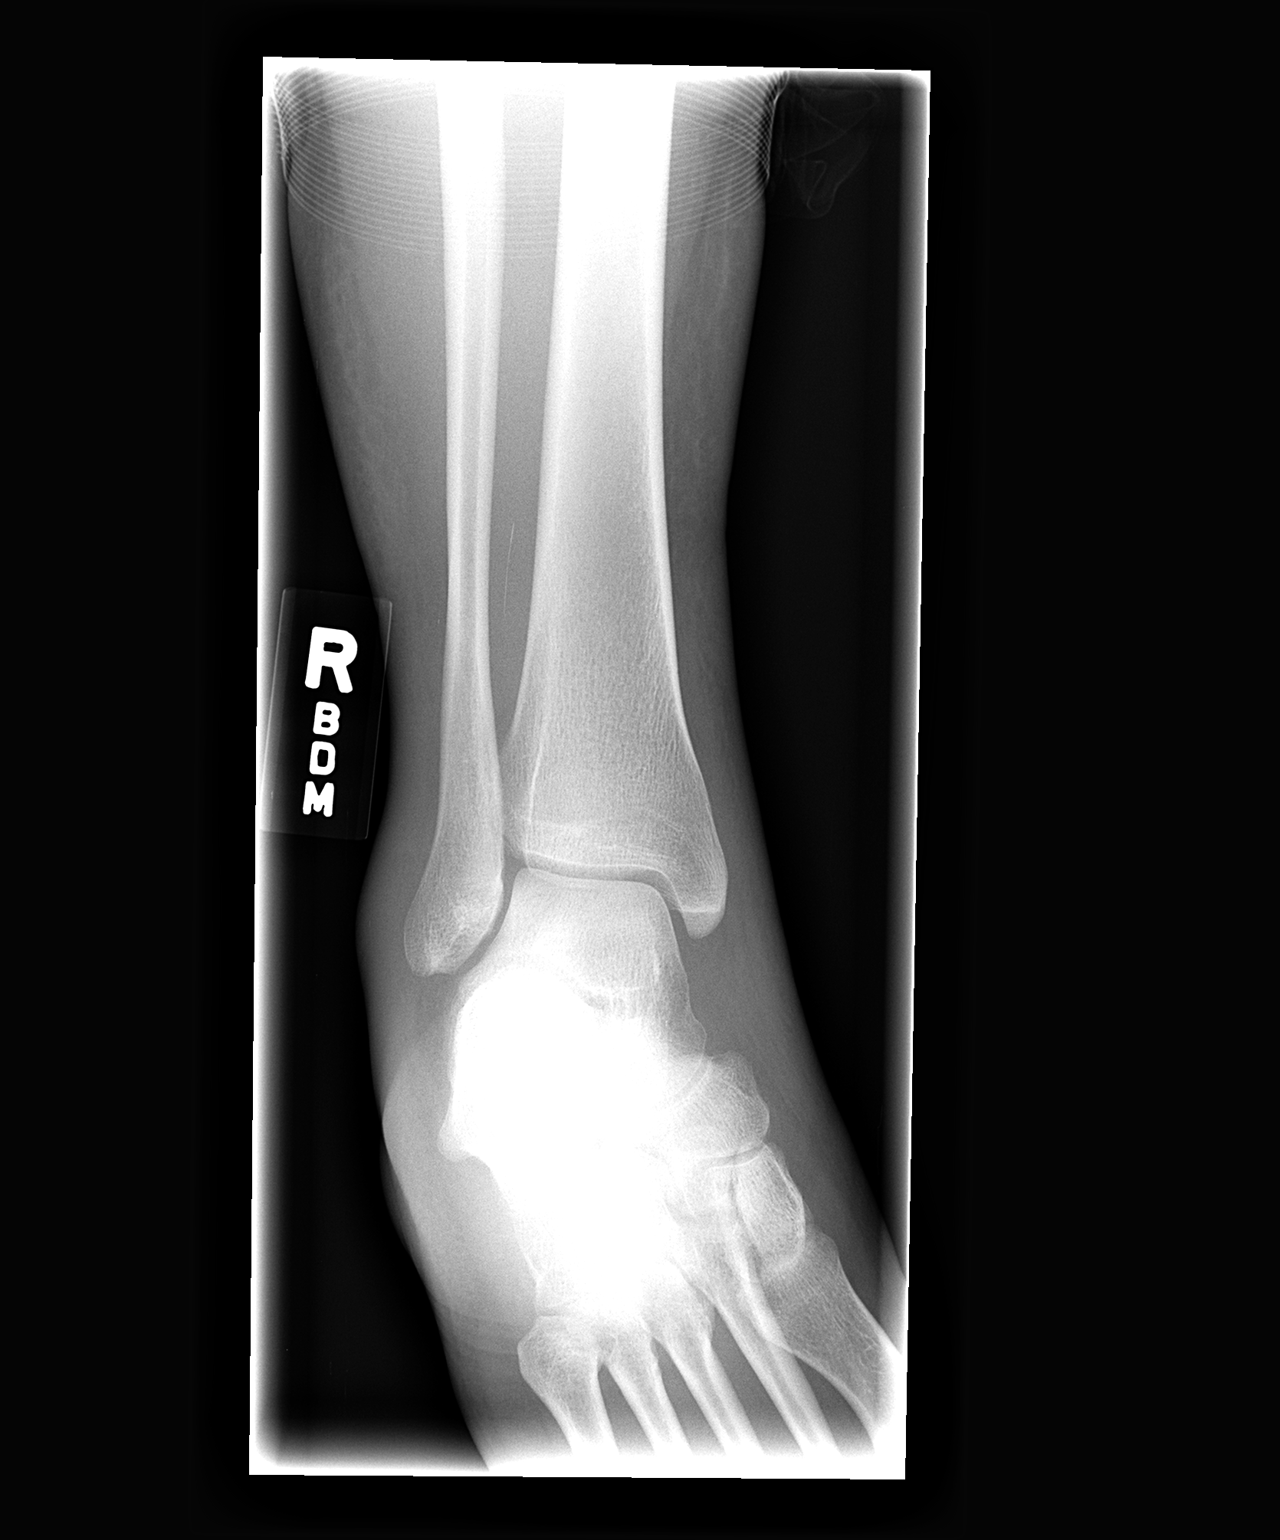

[view not recorded (3 of 3)]
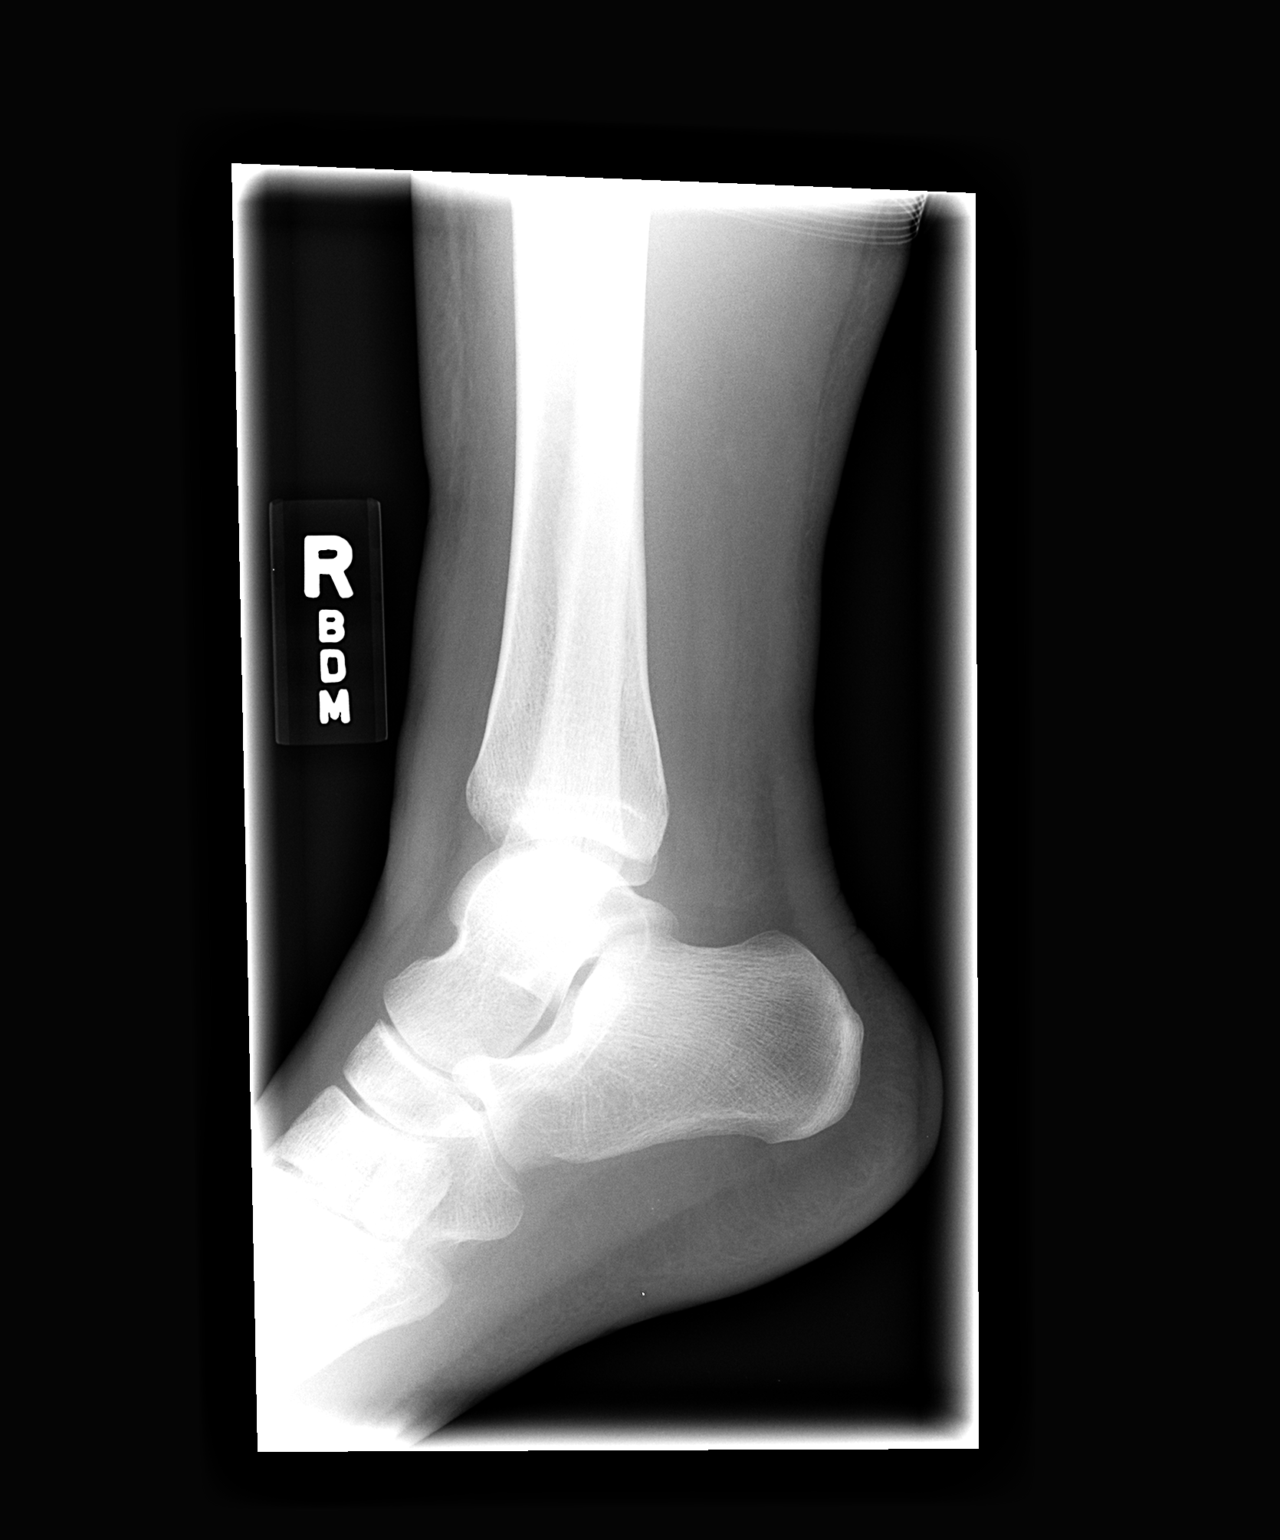

[3 of 3 positions shown; findings below may reference images not displayed]

FINDINGS: Prominent soft tissue swelling is centered around the
ankle joint and foot.  No associated acute fracture.
IMPRESSION: Prominent soft tissue swelling about the ankle joint without acute
osseous abnormality.

## 2015-02-12 ENCOUNTER — Encounter: Payer: Self-pay | Admitting: Family

## 2015-02-12 ENCOUNTER — Ambulatory Visit (INDEPENDENT_AMBULATORY_CARE_PROVIDER_SITE_OTHER): Payer: Managed Care, Other (non HMO) | Admitting: Family

## 2015-02-12 VITALS — BP 122/72 | HR 133 | Temp 97.9°F | Resp 18 | Ht 62.5 in | Wt 119.0 lb

## 2015-02-12 DIAGNOSIS — Z0000 Encounter for general adult medical examination without abnormal findings: Secondary | ICD-10-CM

## 2015-02-12 NOTE — Progress Notes (Signed)
Pre visit review using our clinic review tool, if applicable. No additional management support is needed unless otherwise documented below in the visit note. 

## 2015-02-12 NOTE — Assessment & Plan Note (Signed)
1) Anticipatory Guidance: Discussed importance of wearing a seatbelt while driving and not texting while driving; changing batteries in smoke detector at least once annually; wearing suntan lotion when outside; eating a balanced and moderate diet; getting physical activity at least 30 minutes per day.  2) Immunizations / Screenings / Labs:  All immunizations are up to date per recommendations. All screenings are up to date per recommendations. Obtain CBC, BMET, Lipid profile and TSH.   Overall well exam. Patient has minimal cardiovascular risk factors at this time. She does smoke cigarettes on occasion. Discussed importance of quitting tobacco use altogether and the benefits for her health. Patient will contemplate tobacco cessation. Her heart rate was elevated for today's assessment. Discussed continuing to monitor her heart rate secondary to Vyvanse use. Follow-up prevention exam in 1 year. Follow-up office visit pending lab work when completed.

## 2015-02-12 NOTE — Progress Notes (Signed)
Subjective:    Patient ID: Morgan Braun, female    DOB: 02/17/1991, 24 y.o.   MRN: [id number]  Chief Complaint  Patient presents with  . Establish Care    CPE    HPI:  Morgan Braun is a 24 y.o. female who presents today for an annual wellness visit.   1) Health Maintenance -   Diet - Averages about 3 meals per day; proteins, occasional fruit and vegetables; 3 cups of caffeine per day.   Exercise - None currently    2) Preventative Exams / Immunizations:  Dental -- Up to date  Vision -- Up to date   Health Maintenance  Topic Date Due  . HIV Screening  09/30/2007  . PAP SMEAR  09/29/2010  . INFLUENZA VACCINE  05/24/2015  . TETANUS/TDAP  06/03/2022    Immunization History  Administered Date(s) Administered  . Pneumococcal Polysaccharide-23 06/06/2012  . Tdap 06/03/2012    Allergies  Allergen Reactions  . Bee Venom Swelling    Current Outpatient Prescriptions on File Prior to Visit  Medication Sig Dispense Refill  . FLUoxetine (PROZAC) 40 MG capsule Take 40 mg by mouth daily.    Marland Kitchen. lisdexamfetamine (VYVANSE) 50 MG capsule Take 50 mg by mouth every morning.     No current facility-administered medications on file prior to visit.    Past Medical History  Diagnosis Date  . Anxiety   . ADD (attention deficit disorder)   . Chicken pox   . Allergy   . Migraines     History reviewed. No pertinent past surgical history.  Family History  Problem Relation Age of Onset  . Hypertension Mother   . Alcohol abuse Father   . Kidney disease Maternal Grandmother   . Stroke Maternal Grandfather   . Diabetes Maternal Grandfather   . Heart disease Paternal Grandmother   . Alcohol abuse Paternal Grandfather   . Colon cancer Paternal Grandfather     History   Social History  . Marital Status: Single    Spouse Name: N/A  . Number of Children: 0  . Years of Education: 15   Occupational History  . Student     [employer] - [employer]    Social History Main Topics  . Smoking status: Current Some Day Smoker    Types: Cigarettes  . Smokeless tobacco: Never Used  . Alcohol Use: No     Comment: Rarely  . Drug Use: Yes    Special: Marijuana  . Sexual Activity: Yes    Birth Control/ Protection: Pill     Comment: occassionally   Other Topics Concern  . Not on file   Social History Narrative   Fun: Sherri RadHang out with friends.   Denies religious beliefs effecting health care.     Review of Systems  Constitutional: Denies fever, chills, fatigue, or significant weight gain/loss. HENT: Head: Denies headache or neck pain Ears: Denies changes in hearing, ringing in ears, earache, drainage Nose: Denies discharge, stuffiness, itching, nosebleed, sinus pain Throat: Denies sore throat, hoarseness, dry mouth, sores, thrush Eyes: Denies loss/changes in vision, pain, redness, blurry/double vision, flashing lights Cardiovascular: Denies chest pain/discomfort, tightness, palpitations, shortness of breath with activity, difficulty lying down, swelling, sudden awakening with shortness of breath Respiratory: Denies shortness of breath, cough, sputum production, wheezing Gastrointestinal: Denies dysphasia, heartburn, change in appetite, nausea, change in bowel habits, rectal bleeding, constipation, diarrhea, yellow skin or eyes Genitourinary: Denies frequency, urgency, burning/pain, blood in urine, incontinence, change in urinary strength. Musculoskeletal: Denies  muscle/joint pain, stiffness, back pain, redness or swelling of joints, trauma Skin: Denies rashes, lumps, itching, dryness, color changes, or hair/nail changes Neurological: Denies dizziness, fainting, seizures, weakness, numbness, tingling, tremor Psychiatric - Denies nervousness, stress, depression or memory loss Endocrine: Denies heat or cold intolerance, sweating, frequent urination, excessive thirst, changes in appetite Hematologic: Denies ease of bruising or bleeding      Objective:    BP 122/72 mmHg  Pulse 133  Temp(Src) 97.9 F (36.6 C) (Oral)  Resp 18  Ht 5' 2.5" (1.588 m)  Wt 119 lb (53.978 kg)  BMI 21.41 kg/m2  SpO2 97% Nursing note and vital signs reviewed.  Physical Exam  Constitutional: She is oriented to person, place, and time. She appears well-developed and well-nourished.  HENT:  Head: Normocephalic.  Right Ear: Hearing, tympanic membrane, external ear and ear canal normal.  Left Ear: Hearing, tympanic membrane, external ear and ear canal normal.  Nose: Nose normal.  Mouth/Throat: Uvula is midline, oropharynx is clear and moist and mucous membranes are normal.  Eyes: Conjunctivae and EOM are normal. Pupils are equal, round, and reactive to light.  Neck: Neck supple. No JVD present. No tracheal deviation present. No thyromegaly present.  Cardiovascular: Regular rhythm, normal heart sounds and intact distal pulses.  Tachycardia present.   Pulmonary/Chest: Effort normal and breath sounds normal.  Abdominal: Soft. Bowel sounds are normal. She exhibits no distension and no mass. There is no tenderness. There is no rebound and no guarding.  Musculoskeletal: Normal range of motion. She exhibits no edema or tenderness.  Lymphadenopathy:    She has no cervical adenopathy.  Neurological: She is alert and oriented to person, place, and time. She has normal reflexes. No cranial nerve deficit. She exhibits normal muscle tone. Coordination normal.  Skin: Skin is warm and dry.  Psychiatric: She has a normal mood and affect. Her behavior is normal. Judgment and thought content normal.       Assessment & Plan:

## 2015-02-12 NOTE — Patient Instructions (Signed)
Thank you for choosing [facility].  Summary/Instructions:  Please stop by the lab on the basement level of the building for your blood work. Your results will be released to MyChart (or called to you) after review, usually within 72hours after test completion. If any changes need to be made, you will be notified at that same time.  Health Maintenance Adopting a healthy lifestyle and getting preventive care can go a long way to promote health and wellness. Talk with your health care provider about what schedule of regular examinations is right for you. This is a good chance for you to check in with your provider about disease prevention and staying healthy. In between checkups, there are plenty of things you can do on your own. Experts have done a lot of research about which lifestyle changes and preventive measures are most likely to keep you healthy. Ask your health care provider for more information. WEIGHT AND DIET  Eat a healthy diet  Be sure to include plenty of vegetables, fruits, low-fat dairy products, and lean protein.  Do not eat a lot of foods high in solid fats, added sugars, or salt.  Get regular exercise. This is one of the most important things you can do for your health.  Most adults should exercise for at least 150 minutes each week. The exercise should increase your heart rate and make you sweat (moderate-intensity exercise).  Most adults should also do strengthening exercises at least twice a week. This is in addition to the moderate-intensity exercise.  Maintain a healthy weight  Body mass index (BMI) is a measurement that can be used to identify possible weight problems. It estimates body fat based on height and weight. Your health care provider can help determine your BMI and help you achieve or maintain a healthy weight.  For females 20 years of age and older:   A BMI below 18.5 is considered underweight.  A BMI of 18.5 to 24.9 is normal.  A BMI of 25  to 29.9 is considered overweight.  A BMI of 30 and above is considered obese.  Watch levels of cholesterol and blood lipids  You should start having your blood tested for lipids and cholesterol at 23 years of age, then have this test every 5 years.  You may need to have your cholesterol levels checked more often if:  Your lipid or cholesterol levels are high.  You are older than 23 years of age.  You are at high risk for heart disease.  CANCER SCREENING   Lung Cancer  Lung cancer screening is recommended for adults 55-80 years old who are at high risk for lung cancer because of a history of smoking.  A yearly low-dose CT scan of the lungs is recommended for people who:  Currently smoke.  Have quit within the past 15 years.  Have at least a 30-pack-year history of smoking. A pack year is smoking an average of one pack of cigarettes a day for 1 year.  Yearly screening should continue until it has been 15 years since you quit.  Yearly screening should stop if you develop a health problem that would prevent you from having lung cancer treatment.  Breast Cancer  Practice breast self-awareness. This means understanding how your breasts normally appear and feel.  It also means doing regular breast self-exams. Let your health care provider know about any changes, no matter how small.  If you are in your 20s or 30s, you should have a clinical breast exam (  CBE) by a health care provider every 1-3 years as part of a regular health exam.  If you are 40 or older, have a CBE every year. Also consider having a breast X-ray (mammogram) every year.  If you have a family history of breast cancer, talk to your health care provider about genetic screening.  If you are at high risk for breast cancer, talk to your health care provider about having an MRI and a mammogram every year.  Breast cancer gene (BRCA) assessment is recommended for women who have family members with BRCA-related  cancers. BRCA-related cancers include:  Breast.  Ovarian.  Tubal.  Peritoneal cancers.  Results of the assessment will determine the need for genetic counseling and BRCA1 and BRCA2 testing. Cervical Cancer Routine pelvic examinations to screen for cervical cancer are no longer recommended for nonpregnant women who are considered low risk for cancer of the pelvic organs (ovaries, uterus, and vagina) and who do not have symptoms. A pelvic examination may be necessary if you have symptoms including those associated with pelvic infections. Ask your health care provider if a screening pelvic exam is right for you.   The Pap test is the screening test for cervical cancer for women who are considered at risk.  If you had a hysterectomy for a problem that was not cancer or a condition that could lead to cancer, then you no longer need Pap tests.  If you are older than 65 years, and you have had normal Pap tests for the past 10 years, you no longer need to have Pap tests.  If you have had past treatment for cervical cancer or a condition that could lead to cancer, you need Pap tests and screening for cancer for at least 20 years after your treatment.  If you no longer get a Pap test, assess your risk factors if they change (such as having a new sexual partner). This can affect whether you should start being screened again.  Some women have medical problems that increase their chance of getting cervical cancer. If this is the case for you, your health care provider may recommend more frequent screening and Pap tests.  The human papillomavirus (HPV) test is another test that may be used for cervical cancer screening. The HPV test looks for the virus that can cause cell changes in the cervix. The cells collected during the Pap test can be tested for HPV.  The HPV test can be used to screen women 30 years of age and older. Getting tested for HPV can extend the interval between normal Pap tests from  three to five years.  An HPV test also should be used to screen women of any age who have unclear Pap test results.  After 23 years of age, women should have HPV testing as often as Pap tests.  Colorectal Cancer  This type of cancer can be detected and often prevented.  Routine colorectal cancer screening usually begins at 23 years of age and continues through 23 years of age.  Your health care provider may recommend screening at an earlier age if you have risk factors for colon cancer.  Your health care provider may also recommend using home test kits to check for hidden blood in the stool.  A small camera at the end of a tube can be used to examine your colon directly (sigmoidoscopy or colonoscopy). This is done to check for the earliest forms of colorectal cancer.  Routine screening usually begins at age 50.    Direct examination of the colon should be repeated every 5-10 years through 23 years of age. However, you may need to be screened more often if early forms of precancerous polyps or small growths are found. Skin Cancer  Check your skin from head to toe regularly.  Tell your health care provider about any new moles or changes in moles, especially if there is a change in a mole's shape or color.  Also tell your health care provider if you have a mole that is larger than the size of a pencil eraser.  Always use sunscreen. Apply sunscreen liberally and repeatedly throughout the day.  Protect yourself by wearing long sleeves, pants, a wide-brimmed hat, and sunglasses whenever you are outside. HEART DISEASE, DIABETES, AND HIGH BLOOD PRESSURE   Have your blood pressure checked at least every 1-2 years. High blood pressure causes heart disease and increases the risk of stroke.  If you are between 55 years and 79 years old, ask your health care provider if you should take aspirin to prevent strokes.  Have regular diabetes screenings. This involves taking a blood sample to check  your fasting blood sugar level.  If you are at a normal weight and have a low risk for diabetes, have this test once every three years after 23 years of age.  If you are overweight and have a high risk for diabetes, consider being tested at a younger age or more often. PREVENTING INFECTION  Hepatitis B  If you have a higher risk for hepatitis B, you should be screened for this virus. You are considered at high risk for hepatitis B if:  You were born in a country where hepatitis B is common. Ask your health care provider which countries are considered high risk.  Your parents were born in a high-risk country, and you have not been immunized against hepatitis B (hepatitis B vaccine).  You have HIV or AIDS.  You use needles to inject street drugs.  You live with someone who has hepatitis B.  You have had sex with someone who has hepatitis B.  You get hemodialysis treatment.  You take certain medicines for conditions, including cancer, organ transplantation, and autoimmune conditions. Hepatitis C  Blood testing is recommended for:  Everyone born from 1945 through 1965.  Anyone with known risk factors for hepatitis C. Sexually transmitted infections (STIs)  You should be screened for sexually transmitted infections (STIs) including gonorrhea and chlamydia if:  You are sexually active and are younger than 24 years of age.  You are older than 24 years of age and your health care provider tells you that you are at risk for this type of infection.  Your sexual activity has changed since you were last screened and you are at an increased risk for chlamydia or gonorrhea. Ask your health care provider if you are at risk.  If you do not have HIV, but are at risk, it may be recommended that you take a prescription medicine daily to prevent HIV infection. This is called pre-exposure prophylaxis (PrEP). You are considered at risk if:  You are sexually active and do not regularly use  condoms or know the HIV status of your partner(s).  You take drugs by injection.  You are sexually active with a partner who has HIV. Talk with your health care provider about whether you are at high risk of being infected with HIV. If you choose to begin PrEP, you should first be tested for HIV. You should then be   tested every 3 months for as long as you are taking PrEP.  PREGNANCY   If you are premenopausal and you may become pregnant, ask your health care provider about preconception counseling.  If you may become pregnant, take 400 to 800 micrograms (mcg) of folic acid every day.  If you want to prevent pregnancy, talk to your health care provider about birth control (contraception). OSTEOPOROSIS AND MENOPAUSE   Osteoporosis is a disease in which the bones lose minerals and strength with aging. This can result in serious bone fractures. Your risk for osteoporosis can be identified using a bone density scan.  If you are 65 years of age or older, or if you are at risk for osteoporosis and fractures, ask your health care provider if you should be screened.  Ask your health care provider whether you should take a calcium or vitamin D supplement to lower your risk for osteoporosis.  Menopause may have certain physical symptoms and risks.  Hormone replacement therapy may reduce some of these symptoms and risks. Talk to your health care provider about whether hormone replacement therapy is right for you.  HOME CARE INSTRUCTIONS   Schedule regular health, dental, and eye exams.  Stay current with your immunizations.   Do not use any tobacco products including cigarettes, chewing tobacco, or electronic cigarettes.  If you are pregnant, do not drink alcohol.  If you are breastfeeding, limit how much and how often you drink alcohol.  Limit alcohol intake to no more than 1 drink per day for nonpregnant women. One drink equals 12 ounces of beer, 5 ounces of wine, or 1 ounces of hard  liquor.  Do not use street drugs.  Do not share needles.  Ask your health care provider for help if you need support or information about quitting drugs.  Tell your health care provider if you often feel depressed.  Tell your health care provider if you have ever been abused or do not feel safe at home. Document Released: 04/24/2011 Document Revised: 02/23/2014 Document Reviewed: 09/10/2013 Exit[employer] Patient Information 2015 [employer]. This information is not intended to replace advice given to you by your health care provider. Make sure you discuss any questions you have with your health care provider.  

## 2016-08-07 ENCOUNTER — Telehealth: Payer: Self-pay | Admitting: Family

## 2016-08-07 DIAGNOSIS — Z0000 Encounter for general adult medical examination without abnormal findings: Secondary | ICD-10-CM

## 2016-08-07 NOTE — Telephone Encounter (Signed)
Requesting lab orders to be entered for cpe.  Mother is requesting call back.

## 2016-08-07 NOTE — Telephone Encounter (Signed)
Please advise 

## 2016-08-07 NOTE — Telephone Encounter (Signed)
Blood work orders placed

## 2016-08-07 NOTE — Telephone Encounter (Signed)
Tried calling the mother back. LVM letting her know that labs have been placed.

## 2016-08-23 ENCOUNTER — Other Ambulatory Visit (INDEPENDENT_AMBULATORY_CARE_PROVIDER_SITE_OTHER): Payer: Commercial Managed Care - HMO

## 2016-08-23 ENCOUNTER — Encounter: Payer: Self-pay | Admitting: Family

## 2016-08-23 ENCOUNTER — Ambulatory Visit (INDEPENDENT_AMBULATORY_CARE_PROVIDER_SITE_OTHER): Payer: Commercial Managed Care - HMO | Admitting: Family

## 2016-08-23 VITALS — BP 118/80 | HR 65 | Temp 98.6°F | Resp 16 | Ht 62.5 in | Wt 128.0 lb

## 2016-08-23 DIAGNOSIS — Z0000 Encounter for general adult medical examination without abnormal findings: Secondary | ICD-10-CM | POA: Diagnosis not present

## 2016-08-23 LAB — CBC
HCT: 39 % (ref 36.0–46.0)
Hemoglobin: 13.4 g/dL (ref 12.0–15.0)
MCHC: 34.4 g/dL (ref 30.0–36.0)
MCV: 90.7 fl (ref 78.0–100.0)
PLATELETS: 239 10*3/uL (ref 150.0–400.0)
RBC: 4.29 Mil/uL (ref 3.87–5.11)
RDW: 12.7 % (ref 11.5–15.5)
WBC: 6 10*3/uL (ref 4.0–10.5)

## 2016-08-23 LAB — BASIC METABOLIC PANEL
BUN: 12 mg/dL (ref 6–23)
CALCIUM: 9.5 mg/dL (ref 8.4–10.5)
CO2: 25 mEq/L (ref 19–32)
CREATININE: 0.66 mg/dL (ref 0.40–1.20)
Chloride: 104 mEq/L (ref 96–112)
GFR: 117.04 mL/min (ref >60.00)
Glucose, Bld: 88 mg/dL (ref 70–99)
Potassium: 4.2 mEq/L (ref 3.5–5.1)
SODIUM: 137 meq/L (ref 135–145)

## 2016-08-23 LAB — LIPID PANEL
CHOL/HDL RATIO: 4
CHOLESTEROL: 173 mg/dL (ref 0–200)
HDL: 48.9 mg/dL (ref >39.00)
LDL CALC: 106 mg/dL — AB (ref 0–99)
NonHDL: 124.36
TRIGLYCERIDES: 91 mg/dL (ref 0.0–149.0)
VLDL: 18.2 mg/dL (ref 0.0–40.0)

## 2016-08-23 NOTE — Progress Notes (Signed)
Subjective:    Patient ID: Morgan Braun, female    DOB: 10/03/1991, 25 y.o.   MRN: [id number]  Chief Complaint  Patient presents with  . CPE    already had blood work done    HPI:  Morgan Braun is a 25 y.o. female who presents today for an annual wellness visit.   1) Health Maintenance -   Diet - Averaging about 3 meals per day with snacks consisting of a regular diet; Caffeine intake about 1-2 cups per day on average  Exercise - No structured exercise.    2) Preventative Exams / Immunizations:  Dental -- Up to date  Vision -- Up to date   Health Maintenance  Topic Date Due  . HIV Screening  09/30/2007  . PAP SMEAR  11/02/2017  . TETANUS/TDAP  06/03/2022  . INFLUENZA VACCINE  Completed    Immunization History  Administered Date(s) Administered  . Pneumococcal Polysaccharide-23 06/06/2012  . Tdap 06/03/2012     Allergies  Allergen Reactions  . Bee Venom Swelling     Outpatient Medications Prior to Visit  Medication Sig Dispense Refill  . FLUoxetine (PROZAC) 40 MG capsule Take 40 mg by mouth daily.    Marland Kitchen. lisdexamfetamine (VYVANSE) 50 MG capsule Take 50 mg by mouth every morning.     No facility-administered medications prior to visit.      Past Medical History:  Diagnosis Date  . ADD (attention deficit disorder)   . Allergy   . Anxiety   . Chicken pox   . Migraines     History reviewed. No pertinent surgical history.   Family History  Problem Relation Age of Onset  . Alcohol abuse Paternal Grandfather   . Colon cancer Paternal Grandfather   . Hypertension Mother   . Alcohol abuse Father   . Kidney disease Maternal Grandmother   . Stroke Maternal Grandfather   . Diabetes Maternal Grandfather   . Heart disease Paternal Grandmother      Social History   Social History  . Marital status: Single    Spouse name: N/A  . Number of children: 0  . Years of education: 2716   Occupational History  . [employer]     [employer]  - [employer] Studies   Social History Main Topics  . Smoking status: Current Some Day Smoker    Types: Cigarettes  . Smokeless tobacco: Never Used  . Alcohol use Yes     Comment: Rarely  . Drug use:     Types: Marijuana     Comment: Rarely  . Sexual activity: Yes    Birth control/ protection: Pill     Comment: occassionally   Other Topics Concern  . Not on file   Social History Narrative   Fun: Sherri RadHang out with friends.   Denies religious beliefs effecting health care.       Review of Systems  Constitutional: Denies fever, chills, fatigue, or significant weight gain/loss. HENT: Head: Denies headache or neck pain Ears: Denies changes in hearing, ringing in ears, earache, drainage Nose: Denies discharge, stuffiness, itching, nosebleed, sinus pain Throat: Denies sore throat, hoarseness, dry mouth, sores, thrush Eyes: Denies loss/changes in vision, pain, redness, blurry/double vision, flashing lights Cardiovascular: Denies chest pain/discomfort, tightness, palpitations, shortness of breath with activity, difficulty lying down, swelling, sudden awakening with shortness of breath Respiratory: Denies shortness of breath, cough, sputum production, wheezing Gastrointestinal: Denies dysphasia, heartburn, change in appetite, nausea, change in bowel habits, rectal bleeding, constipation, diarrhea, yellow skin  or eyes Genitourinary: Denies frequency, urgency, burning/pain, blood in urine, incontinence, change in urinary strength. Musculoskeletal: Denies muscle/joint pain, stiffness, back pain, redness or swelling of joints, trauma Skin: Denies rashes, lumps, itching, dryness, color changes, or hair/nail changes Neurological: Denies dizziness, fainting, seizures, weakness, numbness, tingling, tremor Psychiatric - Denies nervousness, stress, depression or memory loss Endocrine: Denies heat or cold intolerance, sweating, frequent urination, excessive thirst, changes in appetite Hematologic:  Denies ease of bruising or bleeding     Objective:     BP 118/80 (BP Location: Left Arm, Patient Position: Sitting, Cuff Size: Normal)   Pulse 65   Temp 98.6 F (37 C) (Oral)   Resp 16   Ht 5' 2.5" (1.588 m)   Wt 128 lb (58.1 kg)   SpO2 98%   BMI 23.04 kg/m  Nursing note and vital signs reviewed.  Physical Exam  Constitutional: She is oriented to person, place, and time. She appears well-developed and well-nourished.  HENT:  Head: Normocephalic.  Right Ear: Hearing, tympanic membrane, external ear and ear canal normal.  Left Ear: Hearing, tympanic membrane, external ear and ear canal normal.  Nose: Nose normal.  Mouth/Throat: Uvula is midline, oropharynx is clear and moist and mucous membranes are normal.  Eyes: Conjunctivae and EOM are normal. Pupils are equal, round, and reactive to light.  Neck: Neck supple. No JVD present. No tracheal deviation present. No thyromegaly present.  Cardiovascular: Normal rate, regular rhythm, normal heart sounds and intact distal pulses.   Pulmonary/Chest: Effort normal and breath sounds normal.  Abdominal: Soft. Bowel sounds are normal. She exhibits no distension and no mass. There is no tenderness. There is no rebound and no guarding.  Musculoskeletal: Normal range of motion. She exhibits no edema or tenderness.  Lymphadenopathy:    She has no cervical adenopathy.  Neurological: She is alert and oriented to person, place, and time. She has normal reflexes. No cranial nerve deficit. She exhibits normal muscle tone. Coordination normal.  Skin: Skin is warm and dry.  Psychiatric: She has a normal mood and affect. Her behavior is normal. Judgment and thought content normal.       Assessment & Plan:   Problem List Items Addressed This Visit      Other   Routine general medical examination at a health care facility - Primary    1) Anticipatory Guidance: Discussed importance of wearing a seatbelt while driving and not texting while driving;  changing batteries in smoke detector at least once annually; wearing suntan lotion when outside; eating a balanced and moderate diet; getting physical activity at least 30 minutes per day.  2) Immunizations / Screenings / Labs:  All immunizations are up to date per recommendations. All screenings are up to date per recommendations. Blood work obtained prior to visit and not available at present time.   Overall well exam with risk factors for cardiovascular disease being minimal at present. Encourage increasing physical activity to 30 minutes daily. Educated regarding smoking cessation to decrease risk for cardiovascular, respiratory and malignancy in the future. She is not ready to quit smoking at this time. Continue other healthly lifestyle behaviors and choices. Follow up prevention exam in 1 year. Follow up office visit as needed pending blood work.         Other Visit Diagnoses   None.      I am having Ms. Yam maintain her lisdexamfetamine and FLUoxetine.   Follow-up: Return in about 1 year (around 08/23/2017), or if symptoms worsen or fail to  improve.   Mauricio Po, FNP

## 2016-08-23 NOTE — Assessment & Plan Note (Signed)
1) Anticipatory Guidance: Discussed importance of wearing a seatbelt while driving and not texting while driving; changing batteries in smoke detector at least once annually; wearing suntan lotion when outside; eating a balanced and moderate diet; getting physical activity at least 30 minutes per day.  2) Immunizations / Screenings / Labs:  All immunizations are up to date per recommendations. All screenings are up to date per recommendations. Blood work obtained prior to visit and not available at present time.   Overall well exam with risk factors for cardiovascular disease being minimal at present. Encourage increasing physical activity to 30 minutes daily. Educated regarding smoking cessation to decrease risk for cardiovascular, respiratory and malignancy in the future. She is not ready to quit smoking at this time. Continue other healthly lifestyle behaviors and choices. Follow up prevention exam in 1 year. Follow up office visit as needed pending blood work.

## 2016-08-23 NOTE — Patient Instructions (Addendum)
Thank you for choosing [facility].  SUMMARY AND INSTRUCTIONS:  Congratulations on your degree!  Medication:  Please continue to take your medication as prescribed.   Your prescription(s) have been submitted to your pharmacy or been printed and provided for you. Please take as directed and contact our office if you believe you are having problem(s) with the medication(s) or have any questions.   Health Maintenance, Female Adopting a healthy lifestyle and getting preventive care can go a long way to promote health and wellness. Talk with your health care provider about what schedule of regular examinations is right for you. This is a good chance for you to check in with your provider about disease prevention and staying healthy. In between checkups, there are plenty of things you can do on your own. Experts have done a lot of research about which lifestyle changes and preventive measures are most likely to keep you healthy. Ask your health care provider for more information. WEIGHT AND DIET  Eat a healthy diet  Be sure to include plenty of vegetables, fruits, low-fat dairy products, and lean protein.  Do not eat a lot of foods high in solid fats, added sugars, or salt.  Get regular exercise. This is one of the most important things you can do for your health.  Most adults should exercise for at least 150 minutes each week. The exercise should increase your heart rate and make you sweat (moderate-intensity exercise).  Most adults should also do strengthening exercises at least twice a week. This is in addition to the moderate-intensity exercise.  Maintain a healthy weight  Body mass index (BMI) is a measurement that can be used to identify possible weight problems. It estimates body fat based on height and weight. Your health care provider can help determine your BMI and help you achieve or maintain a healthy weight.  For females 65 years of age and older:   A BMI below 18.5  is considered underweight.  A BMI of 18.5 to 24.9 is normal.  A BMI of 25 to 29.9 is considered overweight.  A BMI of 30 and above is considered obese.  Watch levels of cholesterol and blood lipids  You should start having your blood tested for lipids and cholesterol at 24 years of age, then have this test every 5 years.  You may need to have your cholesterol levels checked more often if:  Your lipid or cholesterol levels are high.  You are older than 24 years of age.  You are at high risk for heart disease.  CANCER SCREENING   Lung Cancer  Lung cancer screening is recommended for adults 82-40 years old who are at high risk for lung cancer because of a history of smoking.  A yearly low-dose CT scan of the lungs is recommended for people who:  Currently smoke.  Have quit within the past 15 years.  Have at least a 30-pack-year history of smoking. A pack year is smoking an average of one pack of cigarettes a day for 1 year.  Yearly screening should continue until it has been 15 years since you quit.  Yearly screening should stop if you develop a health problem that would prevent you from having lung cancer treatment.  Breast Cancer  Practice breast self-awareness. This means understanding how your breasts normally appear and feel.  It also means doing regular breast self-exams. Let your health care provider know about any changes, no matter how small.  If you are in your 49s or  63s, you should have a clinical breast exam (CBE) by a health care provider every 1-3 years as part of a regular health exam.  If you are 87 or older, have a CBE every year. Also consider having a breast X-ray (mammogram) every year.  If you have a family history of breast cancer, talk to your health care provider about genetic screening.  If you are at high risk for breast cancer, talk to your health care provider about having an MRI and a mammogram every year.  Breast cancer gene (BRCA)  assessment is recommended for women who have family members with BRCA-related cancers. BRCA-related cancers include:  Breast.  Ovarian.  Tubal.  Peritoneal cancers.  Results of the assessment will determine the need for genetic counseling and BRCA1 and BRCA2 testing. Cervical Cancer Your health care provider may recommend that you be screened regularly for cancer of the pelvic organs (ovaries, uterus, and vagina). This screening involves a pelvic examination, including checking for microscopic changes to the surface of your cervix (Pap test). You may be encouraged to have this screening done every 3 years, beginning at age 89.  For women ages 59-65, health care providers may recommend pelvic exams and Pap testing every 3 years, or they may recommend the Pap and pelvic exam, combined with testing for human papilloma virus (HPV), every 5 years. Some types of HPV increase your risk of cervical cancer. Testing for HPV may also be done on women of any age with unclear Pap test results.  Other health care providers may not recommend any screening for nonpregnant women who are considered low risk for pelvic cancer and who do not have symptoms. Ask your health care provider if a screening pelvic exam is right for you.  If you have had past treatment for cervical cancer or a condition that could lead to cancer, you need Pap tests and screening for cancer for at least 20 years after your treatment. If Pap tests have been discontinued, your risk factors (such as having a new sexual partner) need to be reassessed to determine if screening should resume. Some women have medical problems that increase the chance of getting cervical cancer. In these cases, your health care provider may recommend more frequent screening and Pap tests. Colorectal Cancer  This type of cancer can be detected and often prevented.  Routine colorectal cancer screening usually begins at 24 years of age and continues through 24 years  of age.  Your health care provider may recommend screening at an earlier age if you have risk factors for colon cancer.  Your health care provider may also recommend using home test kits to check for hidden blood in the stool.  A small camera at the end of a tube can be used to examine your colon directly (sigmoidoscopy or colonoscopy). This is done to check for the earliest forms of colorectal cancer.  Routine screening usually begins at age 26.  Direct examination of the colon should be repeated every 5-10 years through 24 years of age. However, you may need to be screened more often if early forms of precancerous polyps or small growths are found. Skin Cancer  Check your skin from head to toe regularly.  Tell your health care provider about any new moles or changes in moles, especially if there is a change in a mole's shape or color.  Also tell your health care provider if you have a mole that is larger than the size of a pencil eraser.  Always  use sunscreen. Apply sunscreen liberally and repeatedly throughout the day.  Protect yourself by wearing long sleeves, pants, a wide-brimmed hat, and sunglasses whenever you are outside. HEART DISEASE, DIABETES, AND HIGH BLOOD PRESSURE   High blood pressure causes heart disease and increases the risk of stroke. High blood pressure is more likely to develop in:  People who have blood pressure in the high end of the normal range (130-139/85-89 mm Hg).  People who are overweight or obese.  People who are African American.  If you are 54-47 years of age, have your blood pressure checked every 3-5 years. If you are 26 years of age or older, have your blood pressure checked every year. You should have your blood pressure measured twice--once when you are at a hospital or clinic, and once when you are not at a hospital or clinic. Record the average of the two measurements. To check your blood pressure when you are not at a hospital or clinic, you  can use:  An automated blood pressure machine at a pharmacy.  A home blood pressure monitor.  If you are between 75 years and 73 years old, ask your health care provider if you should take aspirin to prevent strokes.  Have regular diabetes screenings. This involves taking a blood sample to check your fasting blood sugar level.  If you are at a normal weight and have a low risk for diabetes, have this test once every three years after 24 years of age.  If you are overweight and have a high risk for diabetes, consider being tested at a younger age or more often. PREVENTING INFECTION  Hepatitis B  If you have a higher risk for hepatitis B, you should be screened for this virus. You are considered at high risk for hepatitis B if:  You were born in a country where hepatitis B is common. Ask your health care provider which countries are considered high risk.  Your parents were born in a high-risk country, and you have not been immunized against hepatitis B (hepatitis B vaccine).  You have HIV or AIDS.  You use needles to inject street drugs.  You live with someone who has hepatitis B.  You have had sex with someone who has hepatitis B.  You get hemodialysis treatment.  You take certain medicines for conditions, including cancer, organ transplantation, and autoimmune conditions. Hepatitis C  Blood testing is recommended for:  Everyone born from [birth year] through 1964.  Anyone with known risk factors for hepatitis C. Sexually transmitted infections (STIs)  You should be screened for sexually transmitted infections (STIs) including gonorrhea and chlamydia if:  You are sexually active and are younger than 24 years of age.  You are older than 24 years of age and your health care provider tells you that you are at risk for this type of infection.  Your sexual activity has changed since you were last screened and you are at an increased risk for chlamydia or gonorrhea. Ask your health  care provider if you are at risk.  If you do not have HIV, but are at risk, it may be recommended that you take a prescription medicine daily to prevent HIV infection. This is called pre-exposure prophylaxis (PrEP). You are considered at risk if:  You are sexually active and do not regularly use condoms or know the HIV status of your partner(s).  You take drugs by injection.  You are sexually active with a partner who has HIV. Talk with your health care  provider about whether you are at high risk of being infected with HIV. If you choose to begin PrEP, you should first be tested for HIV. You should then be tested every 3 months for as long as you are taking PrEP.  PREGNANCY   If you are premenopausal and you may become pregnant, ask your health care provider about preconception counseling.  If you may become pregnant, take 400 to 800 micrograms (mcg) of folic acid every day.  If you want to prevent pregnancy, talk to your health care provider about birth control (contraception). OSTEOPOROSIS AND MENOPAUSE   Osteoporosis is a disease in which the bones lose minerals and strength with aging. This can result in serious bone fractures. Your risk for osteoporosis can be identified using a bone density scan.  If you are 20 years of age or older, or if you are at risk for osteoporosis and fractures, ask your health care provider if you should be screened.  Ask your health care provider whether you should take a calcium or vitamin D supplement to lower your risk for osteoporosis.  Menopause may have certain physical symptoms and risks.  Hormone replacement therapy may reduce some of these symptoms and risks. Talk to your health care provider about whether hormone replacement therapy is right for you.  HOME CARE INSTRUCTIONS   Schedule regular health, dental, and eye exams.  Stay current with your immunizations.   Do not use any tobacco products including cigarettes, chewing tobacco, or  electronic cigarettes.  If you are pregnant, do not drink alcohol.  If you are breastfeeding, limit how much and how often you drink alcohol.  Limit alcohol intake to no more than 1 drink per day for nonpregnant women. One drink equals 12 ounces of beer, 5 ounces of wine, or 1 ounces of hard liquor.  Do not use street drugs.  Do not share needles.  Ask your health care provider for help if you need support or information about quitting drugs.  Tell your health care provider if you often feel depressed.  Tell your health care provider if you have ever been abused or do not feel safe at home.   This information is not intended to replace advice given to you by your health care provider. Make sure you discuss any questions you have with your health care provider.   Document Released: 04/24/2011 Document Revised: 10/30/2014 Document Reviewed: 09/10/2013 Elsevier Interactive Patient Education 2016 [employer].   Smoking Hazards Smoking cigarettes is extremely bad for your health. Tobacco smoke has over 200 known poisons in it. It contains the poisonous gases nitrogen oxide and carbon monoxide. There are over 60 chemicals in tobacco smoke that cause cancer. Some of the chemicals found in cigarette smoke include:   Cyanide.   Benzene.   Formaldehyde.   Methanol (wood alcohol).   Acetylene (fuel used in welding torches).   Ammonia.  Even smoking lightly shortens your life expectancy by several years. You can greatly reduce the risk of medical problems for you and your family by stopping now. Smoking is the most preventable cause of death and disease in our society. Within days of quitting smoking, your circulation improves, you decrease the risk of having a heart attack, and your lung capacity improves. There may be some increased phlegm in the first few days after quitting, and it may take months for your lungs to clear up completely. Quitting for 10 years reduces your risk of  developing lung cancer to almost that of  a nonsmoker.  WHAT ARE THE RISKS OF SMOKING? Cigarette smokers have an increased risk of many serious medical problems, including:  Lung cancer.   Lung disease (such as pneumonia, bronchitis, and emphysema).   Heart attack and chest pain due to the heart not getting enough oxygen (angina).   Heart disease and peripheral blood vessel disease.   Hypertension.   Stroke.   Oral cancer (cancer of the lip, mouth, or voice box).   Bladder cancer.   Pancreatic cancer.   Cervical cancer.   Pregnancy complications, including premature birth.   Stillbirths and smaller newborn babies, birth defects, and genetic damage to sperm.   Early menopause.   Lower estrogen level for women.   Infertility.   Facial wrinkles.   Blindness.   Increased risk of broken bones (fractures).   Senile dementia.   Stomach ulcers and internal bleeding.   Delayed wound healing and increased risk of complications during surgery. Because of secondhand smoke exposure, children of smokers have an increased risk of the following:   Sudden infant death syndrome (SIDS).   Respiratory infections.   Lung cancer.   Heart disease.   Ear infections.  WHY IS SMOKING ADDICTIVE? Nicotine is the chemical agent in tobacco that is capable of causing addiction or dependence. When you smoke and inhale, nicotine is absorbed rapidly into the bloodstream through your lungs. Both inhaled and noninhaled nicotine may be addictive.  WHAT ARE THE BENEFITS OF QUITTING?  There are many health benefits to quitting smoking. Some are:   The likelihood of developing cancer and heart disease decreases. Health improvements are seen almost immediately.   Blood pressure, pulse rate, and breathing patterns start returning to normal soon after quitting.   People who quit may see an improvement in their overall quality of life.  HOW DO YOU QUIT  SMOKING? Smoking is an addiction with both physical and psychological effects, and longtime habits can be hard to change. Your health care provider can recommend:  Programs and community resources, which may include group support, education, or therapy.  Replacement products, such as patches, gum, and nasal sprays. Use these products only as directed. Do not replace cigarette smoking with electronic cigarettes (commonly called e-cigarettes). The safety of e-cigarettes is unknown, and some may contain harmful chemicals. FOR MORE INFORMATION  American Lung Association: www.lung.org  American Cancer Society: www.cancer.org   This information is not intended to replace advice given to you by your health care provider. Make sure you discuss any questions you have with your health care provider.   Document Released: 11/16/2004 Document Revised: 07/30/2013 Document Reviewed: 03/31/2013 Elsevier Interactive Patient Education 2016 [employer].  Steps to Quit Smoking  Smoking tobacco can be harmful to your health and can affect almost every organ in your body. Smoking puts you, and those around you, at risk for developing many serious chronic diseases. Quitting smoking is difficult, but it is one of the best things that you can do for your health. It is never too late to quit. WHAT ARE THE BENEFITS OF QUITTING SMOKING? When you quit smoking, you lower your risk of developing serious diseases and conditions, such as:  Lung cancer or lung disease, such as COPD.  Heart disease.  Stroke.  Heart attack.  Infertility.  Osteoporosis and bone fractures. Additionally, symptoms such as coughing, wheezing, and shortness of breath may get better when you quit. You may also find that you get sick less often because your body is stronger at fighting off colds  and infections. If you are pregnant, quitting smoking can help to reduce your chances of having a baby of low birth weight. HOW DO I GET READY TO  QUIT? When you decide to quit smoking, create a plan to make sure that you are successful. Before you quit:  Pick a date to quit. Set a date within the next two weeks to give you time to prepare.  Write down the reasons why you are quitting. Keep this list in places where you will see it often, such as on your bathroom mirror or in your car or wallet.  Identify the people, places, things, and activities that make you want to smoke (triggers) and avoid them. Make sure to take these actions:  Throw away all cigarettes at home, at work, and in your car.  Throw away smoking accessories, such as Set designer.  Clean your car and make sure to empty the ashtray.  Clean your home, including curtains and carpets.  Tell your family, friends, and coworkers that you are quitting. Support from your loved ones can make quitting easier.  Talk with your health care provider about your options for quitting smoking.  Find out what treatment options are covered by your health insurance. WHAT STRATEGIES CAN I USE TO QUIT SMOKING?  Talk with your healthcare provider about different strategies to quit smoking. Some strategies include:  Quitting smoking altogether instead of gradually lessening how much you smoke over a period of time. Research shows that quitting "cold [place]" is more successful than gradually quitting.  Attending in-person counseling to help you build problem-solving skills. You are more likely to have success in quitting if you attend several counseling sessions. Even short sessions of 10 minutes can be effective.  Finding resources and support systems that can help you to quit smoking and remain smoke-free after you quit. These resources are most helpful when you use them often. They can include:  Online chats with a Veterinary surgeon.  Telephone quitlines.  Printed Materials engineer.  Support groups or group counseling.  Text messaging programs.  Mobile phone  applications.  Taking medicines to help you quit smoking. (If you are pregnant or breastfeeding, talk with your health care provider first.) Some medicines contain nicotine and some do not. Both types of medicines help with cravings, but the medicines that include nicotine help to relieve withdrawal symptoms. Your health care provider may recommend:  Nicotine patches, gum, or lozenges.  Nicotine inhalers or sprays.  Non-nicotine medicine that is taken by mouth. Talk with your health care provider about combining strategies, such as taking medicines while you are also receiving in-person counseling. Using these two strategies together makes you more likely to succeed in quitting than if you used either strategy on its own. If you are pregnant or breastfeeding, talk with your health care provider about finding counseling or other support strategies to quit smoking. Do not take medicine to help you quit smoking unless told to do so by your health care provider. WHAT THINGS CAN I DO TO MAKE IT EASIER TO QUIT? Quitting smoking might feel overwhelming at first, but there is a lot that you can do to make it easier. Take these important actions:  Reach out to your family and friends and ask that they support and encourage you during this time. Call telephone quitlines, reach out to support groups, or work with a counselor for support.  Ask people who smoke to avoid smoking around you.  Avoid places that trigger you to smoke,  such as bars, parties, or smoke-break areas at work.  Spend time around people who do not smoke.  Lessen stress in your life, because stress can be a smoking trigger for some people. To lessen stress, try:  Exercising regularly.  Deep-breathing exercises.  Yoga.  Meditating.  Performing a body scan. This involves closing your eyes, scanning your body from head to toe, and noticing which parts of your body are particularly tense. Purposefully relax the muscles in those  areas.  Download or purchase mobile phone or tablet apps (applications) that can help you stick to your quit plan by providing reminders, tips, and encouragement. There are many free apps, such as QuitGuide from the [employer] developer for Disease Control and Prevention). You can find other support for quitting smoking (smoking cessation) through smokefree.gov and other websites. HOW WILL I FEEL WHEN I QUIT SMOKING? Within the first 24 hours of quitting smoking, you may start to feel some withdrawal symptoms. These symptoms are usually most noticeable 2-3 days after quitting, but they usually do not last beyond 2-3 weeks. Changes or symptoms that you might experience include:  Mood swings.  Restlessness, anxiety, or irritation.  Difficulty concentrating.  Dizziness.  Strong cravings for sugary foods in addition to nicotine.  Mild weight gain.  Constipation.  Nausea.  Coughing or a sore throat.  Changes in how your medicines work in your body.  A depressed mood.  Difficulty sleeping (insomnia). After the first 2-3 weeks of quitting, you may start to notice more positive results, such as:  Improved sense of smell and taste.  Decreased coughing and sore throat.  Slower heart rate.  Lower blood pressure.  Clearer skin.  The ability to breathe more easily.  Fewer sick days. Quitting smoking is very challenging for most people. Do not get discouraged if you are not successful the first time. Some people need to make many attempts to quit before they achieve long-term success. Do your best to stick to your quit plan, and talk with your health care provider if you have any questions or concerns.   This information is not intended to replace advice given to you by your health care provider. Make sure you discuss any questions you have with your health care provider.   Document Released: 10/03/2001 Document Revised: 02/23/2015 Document Reviewed: 02/23/2015 Elsevier Interactive Patient  Education 2016 [employer].  Smoking Cessation, Tips for Success If you are ready to quit smoking, congratulations! You have chosen to help yourself be healthier. Cigarettes bring nicotine, tar, carbon monoxide, and other irritants into your body. Your lungs, heart, and blood vessels will be able to work better without these poisons. There are many different ways to quit smoking. Nicotine gum, nicotine patches, a nicotine inhaler, or nicotine nasal spray can help with physical craving. Hypnosis, support groups, and medicines help break the habit of smoking. WHAT THINGS CAN I DO TO MAKE QUITTING EASIER?  Here are some tips to help you quit for good:  Pick a date when you will quit smoking completely. Tell all of your friends and family about your plan to quit on that date.  Do not try to slowly cut down on the number of cigarettes you are smoking. Pick a quit date and quit smoking completely starting on that day.  Throw away all cigarettes.   Clean and remove all ashtrays from your home, work, and car.  On a card, write down your reasons for quitting. Carry the card with you and read it when you  get the urge to smoke.  Cleanse your body of nicotine. Drink enough water and fluids to keep your urine clear or pale yellow. Do this after quitting to flush the nicotine from your body.  Learn to predict your moods. Do not let a bad situation be your excuse to have a cigarette. Some situations in your life might tempt you into wanting a cigarette.  Never have "just one" cigarette. It leads to wanting another and another. Remind yourself of your decision to quit.  Change habits associated with smoking. If you smoked while driving or when feeling stressed, try other activities to replace smoking. Stand up when drinking your coffee. Brush your teeth after eating. Sit in a different chair when you read the paper. Avoid alcohol while trying to quit, and try to drink fewer caffeinated beverages. Alcohol  and caffeine may urge you to smoke.  Avoid foods and drinks that can trigger a desire to smoke, such as sugary or spicy foods and alcohol.  Ask people who smoke not to smoke around you.  Have something planned to do right after eating or having a cup of coffee. For example, plan to take a walk or exercise.  Try a relaxation exercise to calm you down and decrease your stress. Remember, you may be tense and nervous for the first 2 weeks after you quit, but this will pass.  Find new activities to keep your hands busy. Play with a pen, coin, or rubber band. Doodle or draw things on paper.  Brush your teeth right after eating. This will help cut down on the craving for the taste of tobacco after meals. You can also try mouthwash.   Use oral substitutes in place of cigarettes. Try using lemon drops, carrots, cinnamon sticks, or chewing gum. Keep them handy so they are available when you have the urge to smoke.  When you have the urge to smoke, try deep breathing.  Designate your home as a nonsmoking area.  If you are a heavy smoker, ask your health care provider about a prescription for nicotine chewing gum. It can ease your withdrawal from nicotine.  Reward yourself. Set aside the cigarette money you save and buy yourself something nice.  Look for support from others. Join a support group or smoking cessation program. Ask someone at home or at work to help you with your plan to quit smoking.  Always ask yourself, "Do I need this cigarette or is this just a reflex?" Tell yourself, "Today, I choose not to smoke," or "I do not want to smoke." You are reminding yourself of your decision to quit.  Do not replace cigarette smoking with electronic cigarettes (commonly called e-cigarettes). The safety of e-cigarettes is unknown, and some may contain harmful chemicals.  If you relapse, do not give up! Plan ahead and think about what you will do the next time you get the urge to smoke. HOW WILL I  FEEL WHEN I QUIT SMOKING? You may have symptoms of withdrawal because your body is used to nicotine (the addictive substance in cigarettes). You may crave cigarettes, be irritable, feel very hungry, cough often, get headaches, or have difficulty concentrating. The withdrawal symptoms are only temporary. They are strongest when you first quit but will go away within 10-14 days. When withdrawal symptoms occur, stay in control. Think about your reasons for quitting. Remind yourself that these are signs that your body is healing and getting used to being without cigarettes. Remember that withdrawal symptoms are easier to  treat than the major diseases that smoking can cause.  Even after the withdrawal is over, expect periodic urges to smoke. However, these cravings are generally short lived and will go away whether you smoke or not. Do not smoke! WHAT RESOURCES ARE AVAILABLE TO HELP ME QUIT SMOKING? Your health care provider can direct you to community resources or hospitals for support, which may include:  Group support.  Education.  Hypnosis.  Therapy.   This information is not intended to replace advice given to you by your health care provider. Make sure you discuss any questions you have with your health care provider.   Document Released: 07/07/2004 Document Revised: 10/30/2014 Document Reviewed: 03/27/2013 Elsevier Interactive Patient Education [employer].

## 2017-08-23 ENCOUNTER — Telehealth: Payer: Self-pay | Admitting: Family

## 2017-08-23 NOTE — Telephone Encounter (Signed)
Pt mother called in and would like to know if you would take this pt on?  This pts mother is a pt of yours

## 2017-08-24 NOTE — Telephone Encounter (Signed)
I can accept

## 2017-09-03 ENCOUNTER — Telehealth: Payer: Self-pay | Admitting: Internal Medicine

## 2017-09-03 DIAGNOSIS — Z0000 Encounter for general adult medical examination without abnormal findings: Secondary | ICD-10-CM

## 2017-09-03 NOTE — Telephone Encounter (Signed)
Mother requesting lab orders to be placed for daughter before CPE.  States this is something Dr. Lawerance BachBurns usually does for her.  Patient is scheduled in Dec.

## 2017-09-04 NOTE — Telephone Encounter (Signed)
ordered

## 2017-09-24 NOTE — Patient Instructions (Addendum)
All other Health Maintenance issues reviewed.   All recommended immunizations and age-appropriate screenings are up-to-date or discussed.  No immunizations administered today.   Medications reviewed and updated.  No changes recommended at this time.    Please followup in one year for a physical    Health Maintenance, Female Adopting a healthy lifestyle and getting preventive care can go a long way to promote health and wellness. Talk with your health care provider about what schedule of regular examinations is right for you. This is a good chance for you to check in with your provider about disease prevention and staying healthy. In between checkups, there are plenty of things you can do on your own. Experts have done a lot of research about which lifestyle changes and preventive measures are most likely to keep you healthy. Ask your health care provider for more information. Weight and diet Eat a healthy diet  Be sure to include plenty of vegetables, fruits, low-fat dairy products, and lean protein.  Do not eat a lot of foods high in solid fats, added sugars, or salt.  Get regular exercise. This is one of the most important things you can do for your health. ? Most adults should exercise for at least 150 minutes each week. The exercise should increase your heart rate and make you sweat (moderate-intensity exercise). ? Most adults should also do strengthening exercises at least twice a week. This is in addition to the moderate-intensity exercise.  Maintain a healthy weight  Body mass index (BMI) is a measurement that can be used to identify possible weight problems. It estimates body fat based on height and weight. Your health care provider can help determine your BMI and help you achieve or maintain a healthy weight.  For females 69 years of age and older: ? A BMI below 18.5 is considered underweight. ? A BMI of 18.5 to 24.9 is normal. ? A BMI of 25 to 29.9 is considered  overweight. ? A BMI of 30 and above is considered obese.  Watch levels of cholesterol and blood lipids  You should start having your blood tested for lipids and cholesterol at 25 years of age, then have this test every 5 years.  You may need to have your cholesterol levels checked more often if: ? Your lipid or cholesterol levels are high. ? You are older than 25 years of age. ? You are at high risk for heart disease.  Cancer screening Lung Cancer  Lung cancer screening is recommended for adults 34-57 years old who are at high risk for lung cancer because of a history of smoking.  A yearly low-dose CT scan of the lungs is recommended for people who: ? Currently smoke. ? Have quit within the past 15 years. ? Have at least a 30-pack-year history of smoking. A pack year is smoking an average of one pack of cigarettes a day for 1 year.  Yearly screening should continue until it has been 15 years since you quit.  Yearly screening should stop if you develop a health problem that would prevent you from having lung cancer treatment.  Breast Cancer  Practice breast self-awareness. This means understanding how your breasts normally appear and feel.  It also means doing regular breast self-exams. Let your health care provider know about any changes, no matter how small.  If you are in your 20s or 30s, you should have a clinical breast exam (CBE) by a health care provider every 1-3 years as part of  a regular health exam.  If you are 19 or older, have a CBE every year. Also consider having a breast X-ray (mammogram) every year.  If you have a family history of breast cancer, talk to your health care provider about genetic screening.  If you are at high risk for breast cancer, talk to your health care provider about having an MRI and a mammogram every year.  Breast cancer gene (BRCA) assessment is recommended for women who have family members with BRCA-related cancers. BRCA-related cancers  include: ? Breast. ? Ovarian. ? Tubal. ? Peritoneal cancers.  Results of the assessment will determine the need for genetic counseling and BRCA1 and BRCA2 testing.  Cervical Cancer Your health care provider may recommend that you be screened regularly for cancer of the pelvic organs (ovaries, uterus, and vagina). This screening involves a pelvic examination, including checking for microscopic changes to the surface of your cervix (Pap test). You may be encouraged to have this screening done every 3 years, beginning at age 34.  For women ages 4-65, health care providers may recommend pelvic exams and Pap testing every 3 years, or they may recommend the Pap and pelvic exam, combined with testing for human papilloma virus (HPV), every 5 years. Some types of HPV increase your risk of cervical cancer. Testing for HPV may also be done on women of any age with unclear Pap test results.  Other health care providers may not recommend any screening for nonpregnant women who are considered low risk for pelvic cancer and who do not have symptoms. Ask your health care provider if a screening pelvic exam is right for you.  If you have had past treatment for cervical cancer or a condition that could lead to cancer, you need Pap tests and screening for cancer for at least 20 years after your treatment. If Pap tests have been discontinued, your risk factors (such as having a new sexual partner) need to be reassessed to determine if screening should resume. Some women have medical problems that increase the chance of getting cervical cancer. In these cases, your health care provider may recommend more frequent screening and Pap tests.  Colorectal Cancer  This type of cancer can be detected and often prevented.  Routine colorectal cancer screening usually begins at 25 years of age and continues through 25 years of age.  Your health care provider may recommend screening at an earlier age if you have risk factors  for colon cancer.  Your health care provider may also recommend using home test kits to check for hidden blood in the stool.  A small camera at the end of a tube can be used to examine your colon directly (sigmoidoscopy or colonoscopy). This is done to check for the earliest forms of colorectal cancer.  Routine screening usually begins at age 37.  Direct examination of the colon should be repeated every 5-10 years through 25 years of age. However, you may need to be screened more often if early forms of precancerous polyps or small growths are found.  Skin Cancer  Check your skin from head to toe regularly.  Tell your health care provider about any new moles or changes in moles, especially if there is a change in a mole's shape or color.  Also tell your health care provider if you have a mole that is larger than the size of a pencil eraser.  Always use sunscreen. Apply sunscreen liberally and repeatedly throughout the day.  Protect yourself by wearing long sleeves,  pants, a wide-brimmed hat, and sunglasses whenever you are outside.  Heart disease, diabetes, and high blood pressure  High blood pressure causes heart disease and increases the risk of stroke. High blood pressure is more likely to develop in: ? People who have blood pressure in the high end of the normal range (130-139/85-89 mm Hg). ? People who are overweight or obese. ? People who are African American.  If you are 18-39 years of age, have your blood pressure checked every 3-5 years. If you are 40 years of age or older, have your blood pressure checked every year. You should have your blood pressure measured twice-once when you are at a hospital or clinic, and once when you are not at a hospital or clinic. Record the average of the two measurements. To check your blood pressure when you are not at a hospital or clinic, you can use: ? An automated blood pressure machine at a pharmacy. ? A home blood pressure monitor.  If  you are between 55 years and 79 years old, ask your health care provider if you should take aspirin to prevent strokes.  Have regular diabetes screenings. This involves taking a blood sample to check your fasting blood sugar level. ? If you are at a normal weight and have a low risk for diabetes, have this test once every three years after 25 years of age. ? If you are overweight and have a high risk for diabetes, consider being tested at a younger age or more often. Preventing infection Hepatitis B  If you have a higher risk for hepatitis B, you should be screened for this virus. You are considered at high risk for hepatitis B if: ? You were born in a country where hepatitis B is common. Ask your health care provider which countries are considered high risk. ? Your parents were born in a high-risk country, and you have not been immunized against hepatitis B (hepatitis B vaccine). ? You have HIV or AIDS. ? You use needles to inject street drugs. ? You live with someone who has hepatitis B. ? You have had sex with someone who has hepatitis B. ? You get hemodialysis treatment. ? You take certain medicines for conditions, including cancer, organ transplantation, and autoimmune conditions.  Hepatitis C  Blood testing is recommended for: ? Everyone born from 1945 through 1965. ? Anyone with known risk factors for hepatitis C.  Sexually transmitted infections (STIs)  You should be screened for sexually transmitted infections (STIs) including gonorrhea and chlamydia if: ? You are sexually active and are younger than 24 years of age. ? You are older than 24 years of age and your health care provider tells you that you are at risk for this type of infection. ? Your sexual activity has changed since you were last screened and you are at an increased risk for chlamydia or gonorrhea. Ask your health care provider if you are at risk.  If you do not have HIV, but are at risk, it may be recommended  that you take a prescription medicine daily to prevent HIV infection. This is called pre-exposure prophylaxis (PrEP). You are considered at risk if: ? You are sexually active and do not regularly use condoms or know the HIV status of your partner(s). ? You take drugs by injection. ? You are sexually active with a partner who has HIV.  Talk with your health care provider about whether you are at high risk of being infected with HIV. If you   choose to begin PrEP, you should first be tested for HIV. You should then be tested every 3 months for as long as you are taking PrEP. Pregnancy  If you are premenopausal and you may become pregnant, ask your health care provider about preconception counseling.  If you may become pregnant, take 400 to 800 micrograms (mcg) of folic acid every day.  If you want to prevent pregnancy, talk to your health care provider about birth control (contraception). Osteoporosis and menopause  Osteoporosis is a disease in which the bones lose minerals and strength with aging. This can result in serious bone fractures. Your risk for osteoporosis can be identified using a bone density scan.  If you are 65 years of age or older, or if you are at risk for osteoporosis and fractures, ask your health care provider if you should be screened.  Ask your health care provider whether you should take a calcium or vitamin D supplement to lower your risk for osteoporosis.  Menopause may have certain physical symptoms and risks.  Hormone replacement therapy may reduce some of these symptoms and risks. Talk to your health care provider about whether hormone replacement therapy is right for you. Follow these instructions at home:  Schedule regular health, dental, and eye exams.  Stay current with your immunizations.  Do not use any tobacco products including cigarettes, chewing tobacco, or electronic cigarettes.  If you are pregnant, do not drink alcohol.  If you are  breastfeeding, limit how much and how often you drink alcohol.  Limit alcohol intake to no more than 1 drink per day for nonpregnant women. One drink equals 12 ounces of beer, 5 ounces of wine, or 1 ounces of hard liquor.  Do not use street drugs.  Do not share needles.  Ask your health care provider for help if you need support or information about quitting drugs.  Tell your health care provider if you often feel depressed.  Tell your health care provider if you have ever been abused or do not feel safe at home. This information is not intended to replace advice given to you by your health care provider. Make sure you discuss any questions you have with your health care provider. Document Released: 04/24/2011 Document Revised: 03/16/2016 Document Reviewed: 07/13/2015 Elsevier Interactive Patient Education  [employer].
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
## 2017-09-25 ENCOUNTER — Other Ambulatory Visit (INDEPENDENT_AMBULATORY_CARE_PROVIDER_SITE_OTHER): Payer: BC Managed Care – PPO

## 2017-09-25 ENCOUNTER — Ambulatory Visit (INDEPENDENT_AMBULATORY_CARE_PROVIDER_SITE_OTHER): Payer: BC Managed Care – PPO | Admitting: Internal Medicine

## 2017-09-25 ENCOUNTER — Telehealth: Payer: Self-pay | Admitting: Family

## 2017-09-25 ENCOUNTER — Encounter: Payer: Self-pay | Admitting: Internal Medicine

## 2017-09-25 VITALS — BP 122/80 | HR 72 | Temp 98.2°F | Resp 16 | Ht 63.0 in | Wt 139.0 lb

## 2017-09-25 DIAGNOSIS — F419 Anxiety disorder, unspecified: Secondary | ICD-10-CM | POA: Diagnosis not present

## 2017-09-25 DIAGNOSIS — Z0000 Encounter for general adult medical examination without abnormal findings: Secondary | ICD-10-CM | POA: Diagnosis not present

## 2017-09-25 DIAGNOSIS — Z0001 Encounter for general adult medical examination with abnormal findings: Secondary | ICD-10-CM | POA: Diagnosis not present

## 2017-09-25 DIAGNOSIS — F988 Other specified behavioral and emotional disorders with onset usually occurring in childhood and adolescence: Secondary | ICD-10-CM | POA: Diagnosis not present

## 2017-09-25 DIAGNOSIS — H6983 Other specified disorders of Eustachian tube, bilateral: Secondary | ICD-10-CM | POA: Insufficient documentation

## 2017-09-25 DIAGNOSIS — Z833 Family history of diabetes mellitus: Secondary | ICD-10-CM

## 2017-09-25 DIAGNOSIS — F429 Obsessive-compulsive disorder, unspecified: Secondary | ICD-10-CM | POA: Insufficient documentation

## 2017-09-25 LAB — HEMOGLOBIN A1C: HEMOGLOBIN A1C: 5.2 % (ref 4.6–6.5)

## 2017-09-25 LAB — CBC WITH DIFFERENTIAL/PLATELET
BASOS ABS: 0 10*3/uL (ref 0.0–0.1)
Basophils Relative: 0.4 % (ref 0.0–3.0)
Eosinophils Absolute: 0.1 10*3/uL (ref 0.0–0.7)
Eosinophils Relative: 1.4 % (ref 0.0–5.0)
HCT: 43.4 % (ref 36.0–46.0)
HEMOGLOBIN: 14.9 g/dL (ref 12.0–15.0)
LYMPHS ABS: 2.8 10*3/uL (ref 0.7–4.0)
Lymphocytes Relative: 32.5 % (ref 12.0–46.0)
MCHC: 34.2 g/dL (ref 30.0–36.0)
MCV: 92.3 fl (ref 78.0–100.0)
MONOS PCT: 3.9 % (ref 3.0–12.0)
Monocytes Absolute: 0.3 10*3/uL (ref 0.1–1.0)
NEUTROS PCT: 61.8 % (ref 43.0–77.0)
Neutro Abs: 5.3 10*3/uL (ref 1.4–7.7)
Platelets: 267 10*3/uL (ref 150.0–400.0)
RBC: 4.71 Mil/uL (ref 3.87–5.11)
RDW: 12.2 % (ref 11.5–15.5)
WBC: 8.7 10*3/uL (ref 4.0–10.5)

## 2017-09-25 LAB — COMPREHENSIVE METABOLIC PANEL
ALK PHOS: 46 U/L (ref 39–117)
ALT: 11 U/L (ref 0–35)
AST: 14 U/L (ref 0–37)
Albumin: 4.6 g/dL (ref 3.5–5.2)
BILIRUBIN TOTAL: 0.5 mg/dL (ref 0.2–1.2)
BUN: 9 mg/dL (ref 6–23)
CO2: 26 meq/L (ref 19–32)
CREATININE: 0.7 mg/dL (ref 0.40–1.20)
Calcium: 9.6 mg/dL (ref 8.4–10.5)
Chloride: 101 mEq/L (ref 96–112)
GFR: 108.38 mL/min (ref >60.00)
GLUCOSE: 91 mg/dL (ref 70–99)
Potassium: 4 mEq/L (ref 3.5–5.1)
SODIUM: 136 meq/L (ref 135–145)
TOTAL PROTEIN: 7.5 g/dL (ref 6.0–8.3)

## 2017-09-25 LAB — LIPID PANEL
CHOL/HDL RATIO: 4
Cholesterol: 199 mg/dL (ref 0–200)
HDL: 49.2 mg/dL (ref >39.00)
LDL Cholesterol: 126 mg/dL — ABNORMAL HIGH (ref 0–99)
NONHDL: 149.5
Triglycerides: 116 mg/dL (ref 0.0–149.0)
VLDL: 23.2 mg/dL (ref 0.0–40.0)

## 2017-09-25 LAB — TSH: TSH: 2.6 u[IU]/mL (ref 0.35–4.50)

## 2017-09-25 MED ORDER — THERA VITAL M PO TABS: 1.0000 | ORAL_TABLET | Freq: Every day | ORAL | Status: AC

## 2017-09-25 NOTE — Assessment & Plan Note (Signed)
Follows with psych Not currently on any medication

## 2017-09-25 NOTE — Telephone Encounter (Signed)
Yes, we can check for this

## 2017-09-25 NOTE — Assessment & Plan Note (Signed)
Having intermittent ear pressure Advised to use flonase prn

## 2017-09-25 NOTE — Assessment & Plan Note (Signed)
Managed by psychiatry Controlled with prozac at current dose

## 2017-09-25 NOTE — Telephone Encounter (Signed)
Copied from CRM [id number]. Topic: Quick Communication - See Telephone Encounter >> Sep 25, 2017  8:43 AM Elliot GaultBell, Tiffany M wrote: CRM for notification. See Telephone encounter for:   09/25/17.  Caller name: Dibenedetto,Maria Relation to pt: mother   Call back number: [phone]   Reason for call:  Patient has a scheduled CPE with Dr. Lawerance BachBurns today, mother wanted to ensure labs order will check diabetes, mother states diabetes run in there family, please confirm

## 2017-09-25 NOTE — Assessment & Plan Note (Signed)
Managed by psych Controlled, stable Continue current dose of medication

## 2017-09-25 NOTE — Assessment & Plan Note (Addendum)
a1c ordered She does not eat very healthy and is not exercising  Low sugar / carb diet Stressed regular exercise, keeping weight down

## 2018-09-25 ENCOUNTER — Telehealth: Payer: Self-pay

## 2018-09-25 DIAGNOSIS — Z0000 Encounter for general adult medical examination without abnormal findings: Secondary | ICD-10-CM

## 2018-09-25 DIAGNOSIS — Z833 Family history of diabetes mellitus: Secondary | ICD-10-CM

## 2018-09-25 NOTE — Telephone Encounter (Signed)
I ordered some basic blood work, but did not order a TSH/thyroid function test since this is not covered by [employer] for physicals.  If they do want this ordered I can order it, but they may need to pay for this.

## 2018-09-25 NOTE — Telephone Encounter (Signed)
Informed her that they were.

## 2018-09-25 NOTE — Telephone Encounter (Signed)
Copied from CRM [phone]. Topic: Appointment Scheduling - Scheduling Inquiry for Clinic >> Sep 25, 2018 10:58 AM Leafy Roobinson, Norma J wrote: Reason for CRM: pt mom is calling and her daughter would like order to have physical labs in morning of 09-27-18 and come back that after for her physical with dr burns

## 2018-09-25 NOTE — Telephone Encounter (Signed)
LVM informing pts mother labs are entered.

## 2018-09-25 NOTE — Telephone Encounter (Signed)
Patient mother called back to make sure that lab orders were in.

## 2018-09-26 NOTE — Patient Instructions (Addendum)
We reviewed your blood work.  All other Health Maintenance issues reviewed.   All recommended immunizations and age-appropriate screenings are up-to-date or discussed.  No immunizations administered today.   Medications reviewed and updated.  Changes include :   none   Please followup in 1 year   Health Maintenance, Female Adopting a healthy lifestyle and getting preventive care can go a long way to promote health and wellness. Talk with your health care provider about what schedule of regular examinations is right for you. This is a good chance for you to check in with your provider about disease prevention and staying healthy. In between checkups, there are plenty of things you can do on your own. Experts have done a lot of research about which lifestyle changes and preventive measures are most likely to keep you healthy. Ask your health care provider for more information. Weight and diet Eat a healthy diet  Be sure to include plenty of vegetables, fruits, low-fat dairy products, and lean protein.  Do not eat a lot of foods high in solid fats, added sugars, or salt.  Get regular exercise. This is one of the most important things you can do for your health. ? Most adults should exercise for at least 150 minutes each week. The exercise should increase your heart rate and make you sweat (moderate-intensity exercise). ? Most adults should also do strengthening exercises at least twice a week. This is in addition to the moderate-intensity exercise.  Maintain a healthy weight  Body mass index (BMI) is a measurement that can be used to identify possible weight problems. It estimates body fat based on height and weight. Your health care provider can help determine your BMI and help you achieve or maintain a healthy weight.  For females 23 years of age and older: ? A BMI below 18.5 is considered underweight. ? A BMI of 18.5 to 24.9 is normal. ? A BMI of 25 to 29.9 is considered  overweight. ? A BMI of 30 and above is considered obese.  Watch levels of cholesterol and blood lipids  You should start having your blood tested for lipids and cholesterol at 26 years of age, then have this test every 5 years.  You may need to have your cholesterol levels checked more often if: ? Your lipid or cholesterol levels are high. ? You are older than 26 years of age. ? You are at high risk for heart disease.  Cancer screening Lung Cancer  Lung cancer screening is recommended for adults 17-39 years old who are at high risk for lung cancer because of a history of smoking.  A yearly low-dose CT scan of the lungs is recommended for people who: ? Currently smoke. ? Have quit within the past 15 years. ? Have at least a 30-pack-year history of smoking. A pack year is smoking an average of one pack of cigarettes a day for 1 year.  Yearly screening should continue until it has been 15 years since you quit.  Yearly screening should stop if you develop a health problem that would prevent you from having lung cancer treatment.  Breast Cancer  Practice breast self-awareness. This means understanding how your breasts normally appear and feel.  It also means doing regular breast self-exams. Let your health care provider know about any changes, no matter how small.  If you are in your 20s or 30s, you should have a clinical breast exam (CBE) by a health care provider every 1-3 years as part of  a regular health exam.  If you are 19 or older, have a CBE every year. Also consider having a breast X-ray (mammogram) every year.  If you have a family history of breast cancer, talk to your health care provider about genetic screening.  If you are at high risk for breast cancer, talk to your health care provider about having an MRI and a mammogram every year.  Breast cancer gene (BRCA) assessment is recommended for women who have family members with BRCA-related cancers. BRCA-related cancers  include: ? Breast. ? Ovarian. ? Tubal. ? Peritoneal cancers.  Results of the assessment will determine the need for genetic counseling and BRCA1 and BRCA2 testing.  Cervical Cancer Your health care provider may recommend that you be screened regularly for cancer of the pelvic organs (ovaries, uterus, and vagina). This screening involves a pelvic examination, including checking for microscopic changes to the surface of your cervix (Pap test). You may be encouraged to have this screening done every 3 years, beginning at age 34.  For women ages 4-65, health care providers may recommend pelvic exams and Pap testing every 3 years, or they may recommend the Pap and pelvic exam, combined with testing for human papilloma virus (HPV), every 5 years. Some types of HPV increase your risk of cervical cancer. Testing for HPV may also be done on women of any age with unclear Pap test results.  Other health care providers may not recommend any screening for nonpregnant women who are considered low risk for pelvic cancer and who do not have symptoms. Ask your health care provider if a screening pelvic exam is right for you.  If you have had past treatment for cervical cancer or a condition that could lead to cancer, you need Pap tests and screening for cancer for at least 20 years after your treatment. If Pap tests have been discontinued, your risk factors (such as having a new sexual partner) need to be reassessed to determine if screening should resume. Some women have medical problems that increase the chance of getting cervical cancer. In these cases, your health care provider may recommend more frequent screening and Pap tests.  Colorectal Cancer  This type of cancer can be detected and often prevented.  Routine colorectal cancer screening usually begins at 26 years of age and continues through 26 years of age.  Your health care provider may recommend screening at an earlier age if you have risk factors  for colon cancer.  Your health care provider may also recommend using home test kits to check for hidden blood in the stool.  A small camera at the end of a tube can be used to examine your colon directly (sigmoidoscopy or colonoscopy). This is done to check for the earliest forms of colorectal cancer.  Routine screening usually begins at age 37.  Direct examination of the colon should be repeated every 5-10 years through 26 years of age. However, you may need to be screened more often if early forms of precancerous polyps or small growths are found.  Skin Cancer  Check your skin from head to toe regularly.  Tell your health care provider about any new moles or changes in moles, especially if there is a change in a mole's shape or color.  Also tell your health care provider if you have a mole that is larger than the size of a pencil eraser.  Always use sunscreen. Apply sunscreen liberally and repeatedly throughout the day.  Protect yourself by wearing long sleeves,  pants, a wide-brimmed hat, and sunglasses whenever you are outside.  Heart disease, diabetes, and high blood pressure  High blood pressure causes heart disease and increases the risk of stroke. High blood pressure is more likely to develop in: ? People who have blood pressure in the high end of the normal range (130-139/85-89 mm Hg). ? People who are overweight or obese. ? People who are African American.  If you are 18-39 years of age, have your blood pressure checked every 3-5 years. If you are 40 years of age or older, have your blood pressure checked every year. You should have your blood pressure measured twice-once when you are at a hospital or clinic, and once when you are not at a hospital or clinic. Record the average of the two measurements. To check your blood pressure when you are not at a hospital or clinic, you can use: ? An automated blood pressure machine at a pharmacy. ? A home blood pressure monitor.  If  you are between 55 years and 79 years old, ask your health care provider if you should take aspirin to prevent strokes.  Have regular diabetes screenings. This involves taking a blood sample to check your fasting blood sugar level. ? If you are at a normal weight and have a low risk for diabetes, have this test once every three years after 26 years of age. ? If you are overweight and have a high risk for diabetes, consider being tested at a younger age or more often. Preventing infection Hepatitis B  If you have a higher risk for hepatitis B, you should be screened for this virus. You are considered at high risk for hepatitis B if: ? You were born in a country where hepatitis B is common. Ask your health care provider which countries are considered high risk. ? Your parents were born in a high-risk country, and you have not been immunized against hepatitis B (hepatitis B vaccine). ? You have HIV or AIDS. ? You use needles to inject street drugs. ? You live with someone who has hepatitis B. ? You have had sex with someone who has hepatitis B. ? You get hemodialysis treatment. ? You take certain medicines for conditions, including cancer, organ transplantation, and autoimmune conditions.  Hepatitis C  Blood testing is recommended for: ? Everyone born from 1945 through 1965. ? Anyone with known risk factors for hepatitis C.  Sexually transmitted infections (STIs)  You should be screened for sexually transmitted infections (STIs) including gonorrhea and chlamydia if: ? You are sexually active and are younger than 26 years of age. ? You are older than 26 years of age and your health care provider tells you that you are at risk for this type of infection. ? Your sexual activity has changed since you were last screened and you are at an increased risk for chlamydia or gonorrhea. Ask your health care provider if you are at risk.  If you do not have HIV, but are at risk, it may be recommended  that you take a prescription medicine daily to prevent HIV infection. This is called pre-exposure prophylaxis (PrEP). You are considered at risk if: ? You are sexually active and do not regularly use condoms or know the HIV status of your partner(s). ? You take drugs by injection. ? You are sexually active with a partner who has HIV.  Talk with your health care provider about whether you are at high risk of being infected with HIV. If you   choose to begin PrEP, you should first be tested for HIV. You should then be tested every 3 months for as long as you are taking PrEP. Pregnancy  If you are premenopausal and you may become pregnant, ask your health care provider about preconception counseling.  If you may become pregnant, take 400 to 800 micrograms (mcg) of folic acid every day.  If you want to prevent pregnancy, talk to your health care provider about birth control (contraception). Osteoporosis and menopause  Osteoporosis is a disease in which the bones lose minerals and strength with aging. This can result in serious bone fractures. Your risk for osteoporosis can be identified using a bone density scan.  If you are 65 years of age or older, or if you are at risk for osteoporosis and fractures, ask your health care provider if you should be screened.  Ask your health care provider whether you should take a calcium or vitamin D supplement to lower your risk for osteoporosis.  Menopause may have certain physical symptoms and risks.  Hormone replacement therapy may reduce some of these symptoms and risks. Talk to your health care provider about whether hormone replacement therapy is right for you. Follow these instructions at home:  Schedule regular health, dental, and eye exams.  Stay current with your immunizations.  Do not use any tobacco products including cigarettes, chewing tobacco, or electronic cigarettes.  If you are pregnant, do not drink alcohol.  If you are  breastfeeding, limit how much and how often you drink alcohol.  Limit alcohol intake to no more than 1 drink per day for nonpregnant women. One drink equals 12 ounces of beer, 5 ounces of wine, or 1 ounces of hard liquor.  Do not use street drugs.  Do not share needles.  Ask your health care provider for help if you need support or information about quitting drugs.  Tell your health care provider if you often feel depressed.  Tell your health care provider if you have ever been abused or do not feel safe at home. This information is not intended to replace advice given to you by your health care provider. Make sure you discuss any questions you have with your health care provider. Document Released: 04/24/2011 Document Revised: 03/16/2016 Document Reviewed: 07/13/2015 Elsevier Interactive Patient Education  [employer].
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
## 2018-09-27 ENCOUNTER — Ambulatory Visit (INDEPENDENT_AMBULATORY_CARE_PROVIDER_SITE_OTHER): Payer: BC Managed Care – PPO | Admitting: Internal Medicine

## 2018-09-27 ENCOUNTER — Encounter: Payer: Self-pay | Admitting: Internal Medicine

## 2018-09-27 ENCOUNTER — Other Ambulatory Visit (INDEPENDENT_AMBULATORY_CARE_PROVIDER_SITE_OTHER): Payer: BC Managed Care – PPO

## 2018-09-27 VITALS — BP 124/82 | HR 82 | Temp 98.7°F | Resp 16 | Ht 63.0 in | Wt 144.0 lb

## 2018-09-27 DIAGNOSIS — J019 Acute sinusitis, unspecified: Secondary | ICD-10-CM | POA: Insufficient documentation

## 2018-09-27 DIAGNOSIS — Z0000 Encounter for general adult medical examination without abnormal findings: Secondary | ICD-10-CM

## 2018-09-27 DIAGNOSIS — F419 Anxiety disorder, unspecified: Secondary | ICD-10-CM

## 2018-09-27 DIAGNOSIS — J0100 Acute maxillary sinusitis, unspecified: Secondary | ICD-10-CM | POA: Diagnosis not present

## 2018-09-27 DIAGNOSIS — Z833 Family history of diabetes mellitus: Secondary | ICD-10-CM

## 2018-09-27 DIAGNOSIS — F988 Other specified behavioral and emotional disorders with onset usually occurring in childhood and adolescence: Secondary | ICD-10-CM

## 2018-09-27 LAB — CBC WITH DIFFERENTIAL/PLATELET
Basophils Absolute: 0.1 10*3/uL (ref 0.0–0.1)
Basophils Relative: 0.7 % (ref 0.0–3.0)
Eosinophils Absolute: 0.2 10*3/uL (ref 0.0–0.7)
Eosinophils Relative: 2.8 % (ref 0.0–5.0)
HCT: 40.1 % (ref 36.0–46.0)
Hemoglobin: 13.7 g/dL (ref 12.0–15.0)
Lymphocytes Relative: 26.9 % (ref 12.0–46.0)
Lymphs Abs: 1.9 10*3/uL (ref 0.7–4.0)
MCHC: 34.1 g/dL (ref 30.0–36.0)
MCV: 91.7 fl (ref 78.0–100.0)
Monocytes Absolute: 0.7 10*3/uL (ref 0.1–1.0)
Monocytes Relative: 9.5 % (ref 3.0–12.0)
NEUTROS ABS: 4.2 10*3/uL (ref 1.4–7.7)
Neutrophils Relative %: 60.1 % (ref 43.0–77.0)
Platelets: 230 10*3/uL (ref 150.0–400.0)
RBC: 4.37 Mil/uL (ref 3.87–5.11)
RDW: 12.2 % (ref 11.5–15.5)
WBC: 7 10*3/uL (ref 4.0–10.5)

## 2018-09-27 LAB — COMPREHENSIVE METABOLIC PANEL
ALT: 7 U/L (ref 0–35)
AST: 11 U/L (ref 0–37)
Albumin: 4.1 g/dL (ref 3.5–5.2)
Alkaline Phosphatase: 46 U/L (ref 39–117)
BUN: 8 mg/dL (ref 6–23)
CO2: 25 meq/L (ref 19–32)
Calcium: 9 mg/dL (ref 8.4–10.5)
Chloride: 104 mEq/L (ref 96–112)
Creatinine, Ser: 0.68 mg/dL (ref 0.40–1.20)
GFR: 111.17 mL/min (ref >60.00)
Glucose, Bld: 93 mg/dL (ref 70–99)
Potassium: 4.4 mEq/L (ref 3.5–5.1)
Sodium: 136 mEq/L (ref 135–145)
Total Bilirubin: 0.3 mg/dL (ref 0.2–1.2)
Total Protein: 7 g/dL (ref 6.0–8.3)

## 2018-09-27 LAB — LIPID PANEL
Cholesterol: 167 mg/dL (ref 0–200)
HDL: 47.7 mg/dL (ref >39.00)
LDL Cholesterol: 101 mg/dL — ABNORMAL HIGH (ref 0–99)
NonHDL: 119.36
Total CHOL/HDL Ratio: 4
Triglycerides: 92 mg/dL (ref 0.0–149.0)
VLDL: 18.4 mg/dL (ref 0.0–40.0)

## 2018-09-27 LAB — HEMOGLOBIN A1C: Hgb A1c MFr Bld: 5.5 % (ref 4.6–6.5)

## 2018-09-27 NOTE — Assessment & Plan Note (Signed)
Symptoms likely viral in nature Continue symptomatic treatment with over-the-counter cold medications, Tylenol/ibuprofen Increase rest and fluids Call if symptoms worsen or do not improve 

## 2018-09-27 NOTE — Assessment & Plan Note (Signed)
Management per psych 

## 2020-08-23 NOTE — Patient Instructions (Addendum)
All other Health Maintenance issues reviewed.   All recommended immunizations and age-appropriate screenings are up-to-date or discussed.  Flu immunization administered today.   Medications reviewed and updated.  Changes include :   none    Please followup in 1 year     Health Maintenance, Female Adopting a healthy lifestyle and getting preventive care are important in promoting health and wellness. Ask your health care provider about:  The right schedule for you to have regular tests and exams.  Things you can do on your own to prevent diseases and keep yourself healthy. What should I know about diet, weight, and exercise? Eat a healthy diet   Eat a diet that includes plenty of vegetables, fruits, low-fat dairy products, and lean protein.  Do not eat a lot of foods that are high in solid fats, added sugars, or sodium. Maintain a healthy weight Body mass index (BMI) is used to identify weight problems. It estimates body fat based on height and weight. Your health care provider can help determine your BMI and help you achieve or maintain a healthy weight. Get regular exercise Get regular exercise. This is one of the most important things you can do for your health. Most adults should:  Exercise for at least 150 minutes each week. The exercise should increase your heart rate and make you sweat (moderate-intensity exercise).  Do strengthening exercises at least twice a week. This is in addition to the moderate-intensity exercise.  Spend less time sitting. Even light physical activity can be beneficial. Watch cholesterol and blood lipids Have your blood tested for lipids and cholesterol at 28 years of age, then have this test every 5 years. Have your cholesterol levels checked more often if:  Your lipid or cholesterol levels are high.  You are older than 28 years of age.  You are at high risk for heart disease. What should I know about cancer screening? Depending on your  health history and family history, you may need to have cancer screening at various ages. This may include screening for:  Breast cancer.  Cervical cancer.  Colorectal cancer.  Skin cancer.  Lung cancer. What should I know about heart disease, diabetes, and high blood pressure? Blood pressure and heart disease  High blood pressure causes heart disease and increases the risk of stroke. This is more likely to develop in people who have high blood pressure readings, are of African descent, or are overweight.  Have your blood pressure checked: ? Every 3-5 years if you are 18-39 years of age. ? Every year if you are 40 years old or older. Diabetes Have regular diabetes screenings. This checks your fasting blood sugar level. Have the screening done:  Once every three years after age 40 if you are at a normal weight and have a low risk for diabetes.  More often and at a younger age if you are overweight or have a high risk for diabetes. What should I know about preventing infection? Hepatitis B If you have a higher risk for hepatitis B, you should be screened for this virus. Talk with your health care provider to find out if you are at risk for hepatitis B infection. Hepatitis C Testing is recommended for:  Everyone born from 1945 through 1965.  Anyone with known risk factors for hepatitis C. Sexually transmitted infections (STIs)  Get screened for STIs, including gonorrhea and chlamydia, if: ? You are sexually active and are younger than 28 years of age. ? You are older than   28 years of age and your health care provider tells you that you are at risk for this type of infection. ? Your sexual activity has changed since you were last screened, and you are at increased risk for chlamydia or gonorrhea. Ask your health care provider if you are at risk.  Ask your health care provider about whether you are at high risk for HIV. Your health care provider may recommend a prescription  medicine to help prevent HIV infection. If you choose to take medicine to prevent HIV, you should first get tested for HIV. You should then be tested every 3 months for as long as you are taking the medicine. Pregnancy  If you are about to stop having your period (premenopausal) and you may become pregnant, seek counseling before you get pregnant.  Take 400 to 800 micrograms (mcg) of folic acid every day if you become pregnant.  Ask for birth control (contraception) if you want to prevent pregnancy. Osteoporosis and menopause Osteoporosis is a disease in which the bones lose minerals and strength with aging. This can result in bone fractures. If you are 65 years old or older, or if you are at risk for osteoporosis and fractures, ask your health care provider if you should:  Be screened for bone loss.  Take a calcium or vitamin D supplement to lower your risk of fractures.  Be given hormone replacement therapy (HRT) to treat symptoms of menopause. Follow these instructions at home: Lifestyle  Do not use any products that contain nicotine or tobacco, such as cigarettes, e-cigarettes, and chewing tobacco. If you need help quitting, ask your health care provider.  Do not use street drugs.  Do not share needles.  Ask your health care provider for help if you need support or information about quitting drugs. Alcohol use  Do not drink alcohol if: ? Your health care provider tells you not to drink. ? You are pregnant, may be pregnant, or are planning to become pregnant.  If you drink alcohol: ? Limit how much you use to 0-1 drink a day. ? Limit intake if you are breastfeeding.  Be aware of how much alcohol is in your drink. In the [place] one drink equals one 12 oz bottle of beer (355 mL), one 5 oz glass of wine (148 mL), or one 1 oz glass of hard liquor (44 mL). General instructions  Schedule regular health, dental, and eye exams.  Stay current with your vaccines.  Tell your health  care provider if: ? You often feel depressed. ? You have ever been abused or do not feel safe at home. Summary  Adopting a healthy lifestyle and getting preventive care are important in promoting health and wellness.  Follow your health care provider's instructions about healthy diet, exercising, and getting tested or screened for diseases.  Follow your health care provider's instructions on monitoring your cholesterol and blood pressure. This information is not intended to replace advice given to you by your health care provider. Make sure you discuss any questions you have with your health care provider. Document Revised: 10/02/2018 Document Reviewed: 10/02/2018 Elsevier Patient Education  2020 [employer]  
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
## 2020-08-24 ENCOUNTER — Encounter: Payer: Self-pay | Admitting: Internal Medicine

## 2020-08-24 ENCOUNTER — Ambulatory Visit (INDEPENDENT_AMBULATORY_CARE_PROVIDER_SITE_OTHER): Payer: BC Managed Care – PPO | Admitting: Internal Medicine

## 2020-08-24 ENCOUNTER — Other Ambulatory Visit: Payer: Self-pay

## 2020-08-24 VITALS — BP 114/78 | HR 68 | Temp 98.1°F | Ht 63.0 in | Wt 153.0 lb

## 2020-08-24 DIAGNOSIS — F429 Obsessive-compulsive disorder, unspecified: Secondary | ICD-10-CM

## 2020-08-24 DIAGNOSIS — F988 Other specified behavioral and emotional disorders with onset usually occurring in childhood and adolescence: Secondary | ICD-10-CM | POA: Diagnosis not present

## 2020-08-24 DIAGNOSIS — Z23 Encounter for immunization: Secondary | ICD-10-CM | POA: Diagnosis not present

## 2020-08-24 DIAGNOSIS — Z0000 Encounter for general adult medical examination without abnormal findings: Secondary | ICD-10-CM

## 2020-08-24 DIAGNOSIS — F419 Anxiety disorder, unspecified: Secondary | ICD-10-CM

## 2020-08-24 DIAGNOSIS — Z833 Family history of diabetes mellitus: Secondary | ICD-10-CM

## 2020-08-24 NOTE — Assessment & Plan Note (Signed)
Chronic A1c normal with last blood work Deferred blood work

## 2020-08-24 NOTE — Assessment & Plan Note (Signed)
Chronic Management per psychiatry 

## 2020-08-24 NOTE — Assessment & Plan Note (Signed)
Chronic Management per psychiatry Anxiety controlled

## 2020-08-24 NOTE — Assessment & Plan Note (Signed)
Chronic Following with psychiatry Has not been taking the Vyvanse

## 2020-08-26 NOTE — Addendum Note (Signed)
Addended by: Karma Ganja on: 08/26/2020 05:01 PM   Modules accepted: Orders

## 2020-12-20 DIAGNOSIS — H93299 Other abnormal auditory perceptions, unspecified ear: Secondary | ICD-10-CM | POA: Insufficient documentation

## 2021-06-10 LAB — HM PAP SMEAR: HM Pap smear: NORMAL
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
## 2021-08-23 NOTE — Patient Instructions (Signed)
Blood work was ordered.     Medications changes include :     Your prescription(s) have been submitted to your pharmacy. Please take as directed and contact our office if you believe you are having problem(s) with the medication(s).   A referral was ordered for        Someone from their office will call you to schedule an appointment.    Please followup in 1 year    Health Maintenance, Female Adopting a healthy lifestyle and getting preventive care are important in promoting health and wellness. Ask your health care provider about: The right schedule for you to have regular tests and exams. Things you can do on your own to prevent diseases and keep yourself healthy. What should I know about diet, weight, and exercise? Eat a healthy diet  Eat a diet that includes plenty of vegetables, fruits, low-fat dairy products, and lean protein. Do not eat a lot of foods that are high in solid fats, added sugars, or sodium. Maintain a healthy weight Body mass index (BMI) is used to identify weight problems. It estimates body fat based on height and weight. Your health care provider can help determine your BMI and help you achieve or maintain a healthy weight. Get regular exercise Get regular exercise. This is one of the most important things you can do for your health. Most adults should: Exercise for at least 150 minutes each week. The exercise should increase your heart rate and make you sweat (moderate-intensity exercise). Do strengthening exercises at least twice a week. This is in addition to the moderate-intensity exercise. Spend less time sitting. Even light physical activity can be beneficial. Watch cholesterol and blood lipids Have your blood tested for lipids and cholesterol at 29 years of age, then have this test every 5 years. Have your cholesterol levels checked more often if: Your lipid or cholesterol levels are high. You are older than 29 years of age. You are at high risk for  heart disease. What should I know about cancer screening? Depending on your health history and family history, you may need to have cancer screening at various ages. This may include screening for: Breast cancer. Cervical cancer. Colorectal cancer. Skin cancer. Lung cancer. What should I know about heart disease, diabetes, and high blood pressure? Blood pressure and heart disease High blood pressure causes heart disease and increases the risk of stroke. This is more likely to develop in people who have high blood pressure readings, are of African descent, or are overweight. Have your blood pressure checked: Every 3-5 years if you are 18-39 years of age. Every year if you are 40 years old or older. Diabetes Have regular diabetes screenings. This checks your fasting blood sugar level. Have the screening done: Once every three years after age 40 if you are at a normal weight and have a low risk for diabetes. More often and at a younger age if you are overweight or have a high risk for diabetes. What should I know about preventing infection? Hepatitis B If you have a higher risk for hepatitis B, you should be screened for this virus. Talk with your health care provider to find out if you are at risk for hepatitis B infection. Hepatitis C Testing is recommended for: Everyone born from 1945 through 1965. Anyone with known risk factors for hepatitis C. Sexually transmitted infections (STIs) Get screened for STIs, including gonorrhea and chlamydia, if: You are sexually active and are younger than 29 years of age.   You are older than 29 years of age and your health care provider tells you that you are at risk for this type of infection. Your sexual activity has changed since you were last screened, and you are at increased risk for chlamydia or gonorrhea. Ask your health care provider if you are at risk. Ask your health care provider about whether you are at high risk for HIV. Your health care  provider may recommend a prescription medicine to help prevent HIV infection. If you choose to take medicine to prevent HIV, you should first get tested for HIV. You should then be tested every 3 months for as long as you are taking the medicine. Pregnancy If you are about to stop having your period (premenopausal) and you may become pregnant, seek counseling before you get pregnant. Take 400 to 800 micrograms (mcg) of folic acid every day if you become pregnant. Ask for birth control (contraception) if you want to prevent pregnancy. Osteoporosis and menopause Osteoporosis is a disease in which the bones lose minerals and strength with aging. This can result in bone fractures. If you are 65 years old or older, or if you are at risk for osteoporosis and fractures, ask your health care provider if you should: Be screened for bone loss. Take a calcium or vitamin D supplement to lower your risk of fractures. Be given hormone replacement therapy (HRT) to treat symptoms of menopause. Follow these instructions at home: Lifestyle Do not use any products that contain nicotine or tobacco, such as cigarettes, e-cigarettes, and chewing tobacco. If you need help quitting, ask your health care provider. Do not use street drugs. Do not share needles. Ask your health care provider for help if you need support or information about quitting drugs. Alcohol use Do not drink alcohol if: Your health care provider tells you not to drink. You are pregnant, may be pregnant, or are planning to become pregnant. If you drink alcohol: Limit how much you use to 0-1 drink a day. Limit intake if you are breastfeeding. Be aware of how much alcohol is in your drink. In the [place] one drink equals one 12 oz bottle of beer (355 mL), one 5 oz glass of wine (148 mL), or one 1 oz glass of hard liquor (44 mL). General instructions Schedule regular health, dental, and eye exams. Stay current with your vaccines. Tell your health  care provider if: You often feel depressed. You have ever been abused or do not feel safe at home. Summary Adopting a healthy lifestyle and getting preventive care are important in promoting health and wellness. Follow your health care provider's instructions about healthy diet, exercising, and getting tested or screened for diseases. Follow your health care provider's instructions on monitoring your cholesterol and blood pressure. This information is not intended to replace advice given to you by your health care provider. Make sure you discuss any questions you have with your health care provider. Document Revised: 12/17/2020 Document Reviewed: 10/02/2018 Elsevier Patient Education  2022 [employer] Inc.  

## 2021-08-24 ENCOUNTER — Encounter: Payer: BC Managed Care – PPO | Admitting: Internal Medicine

## 2021-08-24 DIAGNOSIS — F419 Anxiety disorder, unspecified: Secondary | ICD-10-CM

## 2021-08-24 DIAGNOSIS — Z0000 Encounter for general adult medical examination without abnormal findings: Secondary | ICD-10-CM

## 2021-08-24 DIAGNOSIS — Z833 Family history of diabetes mellitus: Secondary | ICD-10-CM

## 2022-08-13 NOTE — Progress Notes (Signed)
Subjective:    Patient ID: Morgan Braun, female    DOB: [birth year], 31 y.o.   MRN: [id number]      HPI Morgan Braun is here for a Physical exam.    Has bad ear wax.  At times her ears feel clogged.  She is unsure if she has some hearing loss-she normally talks loud.  She denies ear pain.  Overall doing well.  Medications and allergies reviewed with patient and updated if appropriate.  Current Outpatient Medications on File Prior to Visit  Medication Sig Dispense Refill   FLUoxetine (PROZAC) 20 MG tablet Take 20 mg by mouth in the morning, at noon, in the evening, and at bedtime.     FLUoxetine (PROZAC) 40 MG capsule Take by mouth.     Multiple Vitamins-Minerals (MULTIVITAMIN) tablet Take 1 tablet by mouth daily.     JUNEL FE 1/20 1-20 MG-MCG tablet Take 1 tablet by mouth daily. (Patient not taking: Reported on 08/14/2022)  3   No current facility-administered medications on file prior to visit.    Review of Systems  Constitutional:  Negative for fever.  HENT:  Negative for ear pain and hearing loss.        Intermittent ear clogged sensation  Eyes:  Negative for visual disturbance.  Respiratory:  Negative for cough, shortness of breath and wheezing.   Cardiovascular:  Negative for chest pain, palpitations and leg swelling.  Gastrointestinal:  Negative for abdominal pain, blood in stool, constipation, diarrhea and nausea.       No gerd  Genitourinary:  Negative for dysuria.  Musculoskeletal:  Negative for arthralgias and back pain.  Skin:  Negative for rash.  Neurological:  Positive for headaches. Negative for light-headedness.  Psychiatric/Behavioral:  Negative for dysphoric mood. The patient is nervous/anxious (controlled).        Objective:   Vitals:   08/14/22 0930  BP: 116/78  Pulse: 66  Temp: 98.5 F (36.9 C)  SpO2: 97%   Filed Weights   08/14/22 0930  Weight: 180 lb 9.6 oz (81.9 kg)   Body mass index is 31.99 kg/m.  BP Readings from Last 3  Encounters:  08/14/22 116/78  08/24/20 114/78  09/27/18 124/82    Wt Readings from Last 3 Encounters:  08/14/22 180 lb 9.6 oz (81.9 kg)  08/24/20 153 lb (69.4 kg)  09/27/18 144 lb (65.3 kg)       Physical Exam Constitutional: She appears well-developed and well-nourished. No distress.  HENT:  Head: Normocephalic and atraumatic.  Right Ear: External ear normal. Normal ear canal with excessive cerumen, TM not visualized Left Ear: External ear normal.  Normal ear canal with excessive cerumen, TM not visualized Mouth/Throat: Oropharynx is clear and moist.  Eyes: Conjunctivae normal.  Neck: Neck supple. No tracheal deviation present. No thyromegaly present.  No carotid bruit  Cardiovascular: Normal rate, regular rhythm and normal heart sounds.   No murmur heard.  No edema. Pulmonary/Chest: Effort normal and breath sounds normal. No respiratory distress. She has no wheezes. She has no rales.  Breast: deferred   Abdominal: Soft. She exhibits no distension. There is no tenderness.  Lymphadenopathy: She has no cervical adenopathy.  Skin: Skin is warm and dry. She is not diaphoretic.  Psychiatric: She has a normal mood and affect. Her behavior is normal.     Lab Results  Component Value Date   WBC 7.0 09/27/2018   HGB 13.7 09/27/2018   HCT 40.1 09/27/2018   PLT 230.0 09/27/2018   GLUCOSE  93 09/27/2018   CHOL 167 09/27/2018   TRIG 92.0 09/27/2018   HDL 47.70 09/27/2018   LDLCALC 101 (H) 09/27/2018   ALT 7 09/27/2018   AST 11 09/27/2018   NA 136 09/27/2018   K 4.4 09/27/2018   CL 104 09/27/2018   CREATININE 0.68 09/27/2018   BUN 8 09/27/2018   CO2 25 09/27/2018   TSH 2.60 09/25/2017   HGBA1C 5.5 09/27/2018         Assessment & Plan:   Physical exam: Screening blood work  ordered Exercise  active at work - no exercise-stressed the importance of regular exercise for many reasons Weight  has gained weight Substance abuse  quit cigarettes, occ marijuana  use   Reviewed recommended immunizations.   Flu immunization administered today.     Health Maintenance  Topic Date Due   PAP-Cervical Cytology Screening  Never done   PAP SMEAR-Modifier  11/02/2017   INFLUENZA VACCINE  05/23/2022   COVID-19 Vaccine (1) 08/30/2022 (Originally 03/30/1993)   TETANUS/TDAP  08/15/2023 (Originally 06/03/2022)   HPV VACCINES  Aged Out   Hepatitis C Screening  Discontinued   HIV Screening  Discontinued          See Problem List for Assessment and Plan of chronic medical problems.

## 2022-08-13 NOTE — Patient Instructions (Addendum)
Having COVID-vaccine at the pharmacy.    Flu immunization administered today.    Your ears were cleaned out today.    Blood work was ordered.   The lab is on the first floor.    Medications changes include :   none     Return in about 1 year (around 08/15/2023) for Physical Exam.   Health Maintenance, Female Adopting a healthy lifestyle and getting preventive care are important in promoting health and wellness. Ask your health care provider about: The right schedule for you to have regular tests and exams. Things you can do on your own to prevent diseases and keep yourself healthy. What should I know about diet, weight, and exercise? Eat a healthy diet  Eat a diet that includes plenty of vegetables, fruits, low-fat dairy products, and lean protein. Do not eat a lot of foods that are high in solid fats, added sugars, or sodium. Maintain a healthy weight Body mass index (BMI) is used to identify weight problems. It estimates body fat based on height and weight. Your health care provider can help determine your BMI and help you achieve or maintain a healthy weight. Get regular exercise Get regular exercise. This is one of the most important things you can do for your health. Most adults should: Exercise for at least 150 minutes each week. The exercise should increase your heart rate and make you sweat (moderate-intensity exercise). Do strengthening exercises at least twice a week. This is in addition to the moderate-intensity exercise. Spend less time sitting. Even light physical activity can be beneficial. Watch cholesterol and blood lipids Have your blood tested for lipids and cholesterol at 30 years of age, then have this test every 5 years. Have your cholesterol levels checked more often if: Your lipid or cholesterol levels are high. You are older than 30 years of age. You are at high risk for heart disease. What should I know about cancer screening? Depending on your  health history and family history, you may need to have cancer screening at various ages. This may include screening for: Breast cancer. Cervical cancer. Colorectal cancer. Skin cancer. Lung cancer. What should I know about heart disease, diabetes, and high blood pressure? Blood pressure and heart disease High blood pressure causes heart disease and increases the risk of stroke. This is more likely to develop in people who have high blood pressure readings or are overweight. Have your blood pressure checked: Every 3-5 years if you are 26-45 years of age. Every year if you are 49 years old or older. Diabetes Have regular diabetes screenings. This checks your fasting blood sugar level. Have the screening done: Once every three years after age 6 if you are at a normal weight and have a low risk for diabetes. More often and at a younger age if you are overweight or have a high risk for diabetes. What should I know about preventing infection? Hepatitis B If you have a higher risk for hepatitis B, you should be screened for this virus. Talk with your health care provider to find out if you are at risk for hepatitis B infection. Hepatitis C Testing is recommended for: Everyone born from [birth year] through 1965. Anyone with known risk factors for hepatitis C. Sexually transmitted infections (STIs) Get screened for STIs, including gonorrhea and chlamydia, if: You are sexually active and are younger than 30 years of age. You are older than 30 years of age and your health care provider tells you that you are  at risk for this type of infection. Your sexual activity has changed since you were last screened, and you are at increased risk for chlamydia or gonorrhea. Ask your health care provider if you are at risk. Ask your health care provider about whether you are at high risk for HIV. Your health care provider may recommend a prescription medicine to help prevent HIV infection. If you choose to take  medicine to prevent HIV, you should first get tested for HIV. You should then be tested every 3 months for as long as you are taking the medicine. Pregnancy If you are about to stop having your period (premenopausal) and you may become pregnant, seek counseling before you get pregnant. Take 400 to 800 micrograms (mcg) of folic acid every day if you become pregnant. Ask for birth control (contraception) if you want to prevent pregnancy. Osteoporosis and menopause Osteoporosis is a disease in which the bones lose minerals and strength with aging. This can result in bone fractures. If you are 73 years old or older, or if you are at risk for osteoporosis and fractures, ask your health care provider if you should: Be screened for bone loss. Take a calcium or vitamin D supplement to lower your risk of fractures. Be given hormone replacement therapy (HRT) to treat symptoms of menopause. Follow these instructions at home: Alcohol use Do not drink alcohol if: Your health care provider tells you not to drink. You are pregnant, may be pregnant, or are planning to become pregnant. If you drink alcohol: Limit how much you have to: 0-1 drink a day. Know how much alcohol is in your drink. In the [place] one drink equals one 12 oz bottle of beer (355 mL), one 5 oz glass of wine (148 mL), or one 1 oz glass of hard liquor (44 mL). Lifestyle Do not use any products that contain nicotine or tobacco. These products include cigarettes, chewing tobacco, and vaping devices, such as e-cigarettes. If you need help quitting, ask your health care provider. Do not use street drugs. Do not share needles. Ask your health care provider for help if you need support or information about quitting drugs. General instructions Schedule regular health, dental, and eye exams. Stay current with your vaccines. Tell your health care provider if: You often feel depressed. You have ever been abused or do not feel safe at  home. Summary Adopting a healthy lifestyle and getting preventive care are important in promoting health and wellness. Follow your health care provider's instructions about healthy diet, exercising, and getting tested or screened for diseases. Follow your health care provider's instructions on monitoring your cholesterol and blood pressure. This information is not intended to replace advice given to you by your health care provider. Make sure you discuss any questions you have with your health care provider. Document Revised: 02/28/2021 Document Reviewed: 02/28/2021 Elsevier Patient Education  [place].

## 2022-08-14 ENCOUNTER — Encounter: Payer: Self-pay | Admitting: Internal Medicine

## 2022-08-14 ENCOUNTER — Ambulatory Visit (INDEPENDENT_AMBULATORY_CARE_PROVIDER_SITE_OTHER): Payer: BC Managed Care – PPO | Admitting: Internal Medicine

## 2022-08-14 VITALS — BP 116/78 | HR 66 | Temp 98.5°F | Ht 63.0 in | Wt 180.6 lb

## 2022-08-14 DIAGNOSIS — F419 Anxiety disorder, unspecified: Secondary | ICD-10-CM

## 2022-08-14 DIAGNOSIS — F429 Obsessive-compulsive disorder, unspecified: Secondary | ICD-10-CM

## 2022-08-14 DIAGNOSIS — Z833 Family history of diabetes mellitus: Secondary | ICD-10-CM | POA: Diagnosis not present

## 2022-08-14 DIAGNOSIS — Z136 Encounter for screening for cardiovascular disorders: Secondary | ICD-10-CM

## 2022-08-14 DIAGNOSIS — Z23 Encounter for immunization: Secondary | ICD-10-CM | POA: Diagnosis not present

## 2022-08-14 DIAGNOSIS — H6123 Impacted cerumen, bilateral: Secondary | ICD-10-CM | POA: Diagnosis not present

## 2022-08-14 DIAGNOSIS — Z0000 Encounter for general adult medical examination without abnormal findings: Secondary | ICD-10-CM | POA: Diagnosis not present

## 2022-08-14 DIAGNOSIS — F988 Other specified behavioral and emotional disorders with onset usually occurring in childhood and adolescence: Secondary | ICD-10-CM

## 2022-08-14 LAB — CBC WITH DIFFERENTIAL/PLATELET
Basophils Absolute: 0.1 10*3/uL (ref 0.0–0.1)
Basophils Relative: 1.1 % (ref 0.0–3.0)
Eosinophils Absolute: 0.4 10*3/uL (ref 0.0–0.7)
Eosinophils Relative: 5.1 % — ABNORMAL HIGH (ref 0.0–5.0)
HCT: 43.3 % (ref 36.0–46.0)
Hemoglobin: 14.5 g/dL (ref 12.0–15.0)
Lymphocytes Relative: 42.1 % (ref 12.0–46.0)
Lymphs Abs: 3 10*3/uL (ref 0.7–4.0)
MCHC: 33.4 g/dL (ref 30.0–36.0)
MCV: 89.6 fl (ref 78.0–100.0)
Monocytes Absolute: 0.3 10*3/uL (ref 0.1–1.0)
Monocytes Relative: 4.2 % (ref 3.0–12.0)
Neutro Abs: 3.4 10*3/uL (ref 1.4–7.7)
Neutrophils Relative %: 47.5 % (ref 43.0–77.0)
Platelets: 267 10*3/uL (ref 150.0–400.0)
RBC: 4.83 Mil/uL (ref 3.87–5.11)
RDW: 13.1 % (ref 11.5–15.5)
WBC: 7.1 10*3/uL (ref 4.0–10.5)

## 2022-08-14 LAB — COMPREHENSIVE METABOLIC PANEL
ALT: 15 U/L (ref 0–35)
AST: 22 U/L (ref 0–37)
Albumin: 4.5 g/dL (ref 3.5–5.2)
Alkaline Phosphatase: 64 U/L (ref 39–117)
BUN: 10 mg/dL (ref 6–23)
CO2: 25 mEq/L (ref 19–32)
Calcium: 9.5 mg/dL (ref 8.4–10.5)
Chloride: 102 mEq/L (ref 96–112)
Creatinine, Ser: 0.7 mg/dL (ref 0.40–1.20)
GFR: 116.5 mL/min (ref >60.00)
Glucose, Bld: 85 mg/dL (ref 70–99)
Potassium: 4.4 mEq/L (ref 3.5–5.1)
Sodium: 135 mEq/L (ref 135–145)
Total Bilirubin: 0.4 mg/dL (ref 0.2–1.2)
Total Protein: 7.4 g/dL (ref 6.0–8.3)

## 2022-08-14 LAB — HEMOGLOBIN A1C: Hgb A1c MFr Bld: 5.5 % (ref 4.6–6.5)

## 2022-08-14 LAB — LIPID PANEL
Cholesterol: 219 mg/dL — ABNORMAL HIGH (ref 0–200)
HDL: 44.1 mg/dL (ref >39.00)
LDL Cholesterol: 141 mg/dL — ABNORMAL HIGH (ref 0–99)
NonHDL: 174.65
Total CHOL/HDL Ratio: 5
Triglycerides: 167 mg/dL — ABNORMAL HIGH (ref 0.0–149.0)
VLDL: 33.4 mg/dL (ref 0.0–40.0)

## 2022-08-14 LAB — TSH: TSH: 2.73 u[IU]/mL (ref 0.35–5.50)

## 2022-08-14 NOTE — Progress Notes (Signed)
PRE-PROCEDURE EXAM: bilateral TM cannot be visualized due to total occlusion/impaction of the ear canal.  PROCEDURE INDICATION: remove wax to visualize ear drum & relieve discomfort  CONSENT:  Verbal     PROCEDURE NOTE:     bilateral ears:  I used warm water irrigation under direct visualization with the otoscope to free the wax bolus from the ear canal.    POST- PROCEDURE EXAM: TMs successfully visualized and found to have no erythema     The patient tolerated the procedure well.

## 2022-08-14 NOTE — Assessment & Plan Note (Signed)
Chronic Management per psychiatry 

## 2022-08-14 NOTE — Assessment & Plan Note (Addendum)
Acute States she has always had some issues with earwax Has intermittent feeling of clogged sensation in her ears, but denies ear pain Possibly some hearing loss related to earwax-hard to tell CMA successfully cleaned out ears with ear lavage-see procedure note

## 2022-08-14 NOTE — Assessment & Plan Note (Signed)
Chronic Family history of diabetes Check A1c Advised diet low in sugars and carbs and regular exercise

## 2022-08-14 NOTE — Addendum Note (Signed)
Addended by: Marcina Millard on: 08/14/2022 04:14 PM   Modules accepted: Orders

## 2022-08-31 ENCOUNTER — Encounter: Payer: Self-pay | Admitting: Internal Medicine

## 2022-08-31 NOTE — Progress Notes (Signed)
Outside notes received. Information abstracted. Notes sent to scan.
